# Patient Record
Sex: Female | Born: 1980 | Race: White | Hispanic: No | Marital: Married | Smoking: Former smoker
Health system: Southern US, Community
[De-identification: ages and names within clinical notes are randomized; demographics above are authoritative.]

## PROBLEM LIST (undated history)

## (undated) ENCOUNTER — Inpatient Hospital Stay (HOSPITAL_COMMUNITY): Payer: Self-pay

## (undated) DIAGNOSIS — F329 Major depressive disorder, single episode, unspecified: Secondary | ICD-10-CM

## (undated) DIAGNOSIS — J302 Other seasonal allergic rhinitis: Secondary | ICD-10-CM

## (undated) DIAGNOSIS — K219 Gastro-esophageal reflux disease without esophagitis: Secondary | ICD-10-CM

## (undated) DIAGNOSIS — Z872 Personal history of diseases of the skin and subcutaneous tissue: Secondary | ICD-10-CM

## (undated) DIAGNOSIS — F32A Depression, unspecified: Secondary | ICD-10-CM

## (undated) DIAGNOSIS — K5900 Constipation, unspecified: Secondary | ICD-10-CM

## (undated) DIAGNOSIS — O1205 Gestational edema, complicating the puerperium: Secondary | ICD-10-CM

## (undated) HISTORY — PX: NO PAST SURGERIES: SHX2092

## (undated) HISTORY — PX: WISDOM TOOTH EXTRACTION: SHX21

---
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
## 2003-05-12 ENCOUNTER — Other Ambulatory Visit: Admission: RE | Admit: 2003-05-12 | Discharge: 2003-05-12 | Payer: Self-pay | Admitting: Obstetrics & Gynecology

## 2003-11-19 ENCOUNTER — Emergency Department (HOSPITAL_COMMUNITY): Admission: EM | Admit: 2003-11-19 | Discharge: 2003-11-19 | Payer: Self-pay | Admitting: Emergency Medicine

## 2005-06-06 ENCOUNTER — Other Ambulatory Visit: Admission: RE | Admit: 2005-06-06 | Discharge: 2005-06-06 | Payer: Self-pay | Admitting: Family Medicine

## 2005-12-18 ENCOUNTER — Other Ambulatory Visit: Admission: RE | Admit: 2005-12-18 | Discharge: 2005-12-18 | Payer: Self-pay | Admitting: Family Medicine

## 2006-07-22 ENCOUNTER — Other Ambulatory Visit: Admission: RE | Admit: 2006-07-22 | Discharge: 2006-07-22 | Payer: Self-pay | Admitting: Family Medicine

## 2007-02-21 ENCOUNTER — Other Ambulatory Visit: Admission: RE | Admit: 2007-02-21 | Discharge: 2007-02-21 | Payer: Self-pay | Admitting: Family Medicine

## 2007-09-09 ENCOUNTER — Other Ambulatory Visit: Admission: RE | Admit: 2007-09-09 | Discharge: 2007-09-09 | Payer: Self-pay | Admitting: Family Medicine

## 2008-09-28 ENCOUNTER — Other Ambulatory Visit: Admission: RE | Admit: 2008-09-28 | Discharge: 2008-09-28 | Payer: Self-pay | Admitting: Family Medicine

## 2009-10-24 ENCOUNTER — Other Ambulatory Visit: Admission: RE | Admit: 2009-10-24 | Discharge: 2009-10-24 | Payer: Self-pay | Admitting: Family Medicine

## 2011-01-24 ENCOUNTER — Other Ambulatory Visit (HOSPITAL_COMMUNITY)
Admission: RE | Admit: 2011-01-24 | Discharge: 2011-01-24 | Disposition: A | Discharge status: Home / Self Care | Payer: BC Managed Care – PPO | Source: Ambulatory Visit | Attending: Family Medicine | Admitting: Family Medicine

## 2011-01-24 ENCOUNTER — Other Ambulatory Visit: Payer: Self-pay | Admitting: Family Medicine

## 2011-01-24 DIAGNOSIS — Z0000 Encounter for general adult medical examination without abnormal findings: Secondary | ICD-10-CM | POA: Insufficient documentation

## 2011-06-15 ENCOUNTER — Other Ambulatory Visit: Payer: Self-pay | Admitting: Dermatology

## 2012-04-01 ENCOUNTER — Other Ambulatory Visit: Payer: Self-pay | Admitting: Dermatology

## 2014-05-26 ENCOUNTER — Other Ambulatory Visit (HOSPITAL_COMMUNITY)
Admission: RE | Admit: 2014-05-26 | Discharge: 2014-05-26 | Disposition: A | Discharge status: Home / Self Care | Payer: BC Managed Care – PPO | Source: Ambulatory Visit | Attending: Family Medicine | Admitting: Family Medicine

## 2014-05-26 ENCOUNTER — Other Ambulatory Visit: Payer: Self-pay | Admitting: Family Medicine

## 2014-05-26 DIAGNOSIS — Z0000 Encounter for general adult medical examination without abnormal findings: Secondary | ICD-10-CM | POA: Insufficient documentation

## 2014-05-27 LAB — CYTOLOGY - PAP

## 2015-09-08 ENCOUNTER — Encounter (HOSPITAL_COMMUNITY): Payer: Self-pay | Admitting: Emergency Medicine

## 2015-09-08 ENCOUNTER — Emergency Department (HOSPITAL_COMMUNITY)
Admission: EM | Admit: 2015-09-08 | Discharge: 2015-09-08 | Disposition: A | Discharge status: Home / Self Care | Payer: BC Managed Care – PPO | Attending: Emergency Medicine | Admitting: Emergency Medicine

## 2015-09-08 DIAGNOSIS — Y92410 Unspecified street and highway as the place of occurrence of the external cause: Secondary | ICD-10-CM | POA: Insufficient documentation

## 2015-09-08 DIAGNOSIS — S0990XA Unspecified injury of head, initial encounter: Secondary | ICD-10-CM | POA: Diagnosis present

## 2015-09-08 DIAGNOSIS — Y9389 Activity, other specified: Secondary | ICD-10-CM | POA: Insufficient documentation

## 2015-09-08 DIAGNOSIS — Y998 Other external cause status: Secondary | ICD-10-CM | POA: Insufficient documentation

## 2015-09-08 DIAGNOSIS — R519 Headache, unspecified: Secondary | ICD-10-CM

## 2015-09-08 DIAGNOSIS — R51 Headache: Secondary | ICD-10-CM

## 2015-09-08 MED ORDER — IBUPROFEN 400 MG PO TABS
600.0000 mg | ORAL_TABLET | Freq: Once | ORAL | Status: AC
  Administered 2015-09-08: 600 mg via ORAL
  Filled 2015-09-08: qty 2
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
## 2015-09-08 NOTE — Discharge Instructions (Signed)
Please monitor for new or worsening signs or symptoms, return immediately. Please follow-up with her primary care provider if symptoms persist. Please use ibuprofen or Tylenol as needed.

## 2016-01-31 LAB — OB RESULTS CONSOLE ABO/RH: RH Type: POSITIVE

## 2016-01-31 LAB — OB RESULTS CONSOLE GC/CHLAMYDIA
Chlamydia: NEGATIVE
Gonorrhea: NEGATIVE

## 2016-01-31 LAB — OB RESULTS CONSOLE RUBELLA ANTIBODY, IGM: Rubella: IMMUNE

## 2016-01-31 LAB — OB RESULTS CONSOLE RPR: RPR: NONREACTIVE

## 2016-01-31 LAB — OB RESULTS CONSOLE HIV ANTIBODY (ROUTINE TESTING): HIV: NONREACTIVE

## 2016-01-31 LAB — OB RESULTS CONSOLE HEPATITIS B SURFACE ANTIGEN: Hepatitis B Surface Ag: NEGATIVE

## 2016-01-31 LAB — OB RESULTS CONSOLE ANTIBODY SCREEN: Antibody Screen: NEGATIVE

## 2016-08-09 ENCOUNTER — Inpatient Hospital Stay (HOSPITAL_COMMUNITY)
Admission: AD | Admit: 2016-08-09 | Discharge: 2016-08-09 | Disposition: A | Discharge status: Home / Self Care | Payer: BC Managed Care – PPO | Source: Ambulatory Visit | Attending: Obstetrics | Admitting: Obstetrics

## 2016-08-09 ENCOUNTER — Encounter (HOSPITAL_COMMUNITY): Payer: Self-pay | Admitting: *Deleted

## 2016-08-09 DIAGNOSIS — Z3689 Encounter for other specified antenatal screening: Secondary | ICD-10-CM

## 2016-08-09 DIAGNOSIS — K219 Gastro-esophageal reflux disease without esophagitis: Secondary | ICD-10-CM | POA: Diagnosis not present

## 2016-08-09 DIAGNOSIS — Z87891 Personal history of nicotine dependence: Secondary | ICD-10-CM | POA: Diagnosis not present

## 2016-08-09 DIAGNOSIS — Z3A36 36 weeks gestation of pregnancy: Secondary | ICD-10-CM

## 2016-08-09 DIAGNOSIS — Z3A35 35 weeks gestation of pregnancy: Secondary | ICD-10-CM | POA: Insufficient documentation

## 2016-08-09 DIAGNOSIS — O479 False labor, unspecified: Secondary | ICD-10-CM

## 2016-08-09 DIAGNOSIS — O99613 Diseases of the digestive system complicating pregnancy, third trimester: Secondary | ICD-10-CM | POA: Insufficient documentation

## 2016-08-09 DIAGNOSIS — O4703 False labor before 37 completed weeks of gestation, third trimester: Secondary | ICD-10-CM | POA: Diagnosis not present

## 2016-08-09 DIAGNOSIS — O368130 Decreased fetal movements, third trimester, not applicable or unspecified: Secondary | ICD-10-CM | POA: Diagnosis not present

## 2016-08-09 DIAGNOSIS — Z3493 Encounter for supervision of normal pregnancy, unspecified, third trimester: Secondary | ICD-10-CM

## 2016-08-09 HISTORY — DX: Other seasonal allergic rhinitis: J30.2

## 2016-08-09 HISTORY — DX: Gastro-esophageal reflux disease without esophagitis: K21.9

## 2016-08-09 LAB — URINALYSIS, ROUTINE W REFLEX MICROSCOPIC
Bilirubin Urine: NEGATIVE
Glucose, UA: NEGATIVE mg/dL
Hgb urine dipstick: NEGATIVE
Ketones, ur: NEGATIVE mg/dL
Leukocytes, UA: NEGATIVE
Nitrite: NEGATIVE
Protein, ur: NEGATIVE mg/dL
Specific Gravity, Urine: 1.015 (ref 1.005–1.030)
pH: 6 (ref 5.0–8.0)

## 2016-08-09 NOTE — MAU Provider Note (Signed)
History     CSN: [id number]  Arrival date and time: 08/09/16 1712   None     Chief Complaint  Patient presents with  . Contractions  . Decreased Fetal Movement   G1 @35 .5 weeks here with decreased fetal movement and abd tightening. She reports decreased movement throughout the day. She also reports intermittent tightening and sudden onset of pelvic pressure. She denies regular ctx, VB, and LOF. She has felt good FM since EFM was applied.     OB History    Gravida Para Term Preterm AB Living   1             SAB TAB Ectopic Multiple Live Births                  Past Medical History:  Diagnosis Date  . GERD (gastroesophageal reflux disease)   . Seasonal allergies     Past Surgical History:  Procedure Laterality Date  . NO PAST SURGERIES      History reviewed. No pertinent family history.  Social History  Substance Use Topics  . Smoking status: Former Smoker    Quit date: 08/09/2001  . Smokeless tobacco: Never Used  . Alcohol use Yes     Comment: socially/not since pregnancy    Allergies: No Known Allergies  No prescriptions prior to admission.    Review of Systems  Constitutional: Negative.   Gastrointestinal: Positive for abdominal pain.  Genitourinary: Negative.    Physical Exam   Blood pressure 131/81, pulse 92, temperature 98.3 F (36.8 C), temperature source Oral, resp. rate 18, last menstrual period 09/08/2015.  Physical Exam  Constitutional: She is oriented to person, place, and time. She appears well-developed and well-nourished.  HENT:  Head: Normocephalic and atraumatic.  Neck: Normal range of motion. Neck supple.  Cardiovascular: Normal rate.   Respiratory: Effort normal.  GI: Soft. She exhibits no distension. There is no tenderness.  gravid  Genitourinary:  Genitourinary Comments: SVE closed/thick/-2  Musculoskeletal: Normal range of motion.  Neurological: She is alert and oriented to person, place, and time.  Skin: Skin is warm and  dry.  Psychiatric: She has a normal mood and affect.  EFM: 140 bpm, mod variability, + accels, no decels Toco: irregular  Results for orders placed or performed during the hospital encounter of 08/09/16 (from the past 24 hour(s))  Urinalysis, Routine w reflex microscopic (not at [facility])     Status: None   Collection Time: 08/09/16  5:32 PM  Result Value Ref Range   Color, Urine YELLOW YELLOW   APPearance CLEAR CLEAR   Specific Gravity, Urine 1.015 1.005 - 1.030   pH 6.0 5.0 - 8.0   Glucose, UA NEGATIVE NEGATIVE mg/dL   Hgb urine dipstick NEGATIVE NEGATIVE   Bilirubin Urine NEGATIVE NEGATIVE   Ketones, ur NEGATIVE NEGATIVE mg/dL   Protein, ur NEGATIVE NEGATIVE mg/dL   Nitrite NEGATIVE NEGATIVE   Leukocytes, UA NEGATIVE NEGATIVE    MAU Course  Procedures NST MDM Reactive NST, reported FM since arrival, and audible FM while on EFM. Pressure likely 2/2 fetal engagement. No evidence of PTL. Discussed presentation, exam findings, and NST with Dr. Pamala Hurry. Stable for discharge home.   Assessment and Plan   1. Decreased fetal movements, third trimester, not applicable or unspecified fetus   2. NST (non-stress test) reactive   3. Braxton Hick's contraction   4. Lightening of fetus during pregnancy in third trimester    Discharge home Increase water intake FMCs Follow up as  scheduled in office next week  Julianne Handler, CNM 08/09/2016, 7:08 PM   Case d/w me. Agree with care plan. Non-emergent exam, high beta, u/s scheduled in office for 08/15/16  Particia Strahm A. 08/10/2016 2:22 PM

## 2016-08-09 NOTE — MAU Note (Signed)
Pt advised to come to MAU, called provider.  Started having uc's yesterday around 1500, increased pressure & rectum for the last 3 hours.  Decreased FM today.  Denies bleeding or LOF.

## 2016-08-09 NOTE — Discharge Instructions (Signed)
Fetal Movement Counts  Patient Name: __________________________________________________ Patient Due Date: ____________________  Performing a fetal movement count is highly recommended in high-risk pregnancies, but it is good for every pregnant woman to do. Your health care provider may ask you to start counting fetal movements at 28 weeks of the pregnancy. Fetal movements often increase:  · After eating a full meal.  · After physical activity.  · After eating or drinking something sweet or cold.  · At rest.  Pay attention to when you feel the baby is most active. This will help you notice a pattern of your baby's sleep and wake cycles and what factors contribute to an increase in fetal movement. It is important to perform a fetal movement count at the same time each day when your baby is normally most active.   HOW TO COUNT FETAL MOVEMENTS  1. Find a quiet and comfortable area to sit or lie down on your left side. Lying on your left side provides the best blood and oxygen circulation to your baby.  2. Write down the day and time on a sheet of paper or in a journal.  3. Start counting kicks, flutters, swishes, rolls, or jabs in a 2-hour period. You should feel at least 10 movements within 2 hours.  4. If you do not feel 10 movements in 2 hours, wait 2-3 hours and count again. Look for a change in the pattern or not enough counts in 2 hours.  SEEK MEDICAL CARE IF:  · You feel less than 10 counts in 2 hours, tried twice.  · There is no movement in over an hour.  · The pattern is changing or taking longer each day to reach 10 counts in 2 hours.  · You feel the baby is not moving as he or she usually does.  Date: ____________ Movements: ____________ Start time: ____________ Finish time: ____________   Date: ____________ Movements: ____________ Start time: ____________ Finish time: ____________  Date: ____________ Movements: ____________ Start time: ____________ Finish time: ____________  Date: ____________ Movements:  ____________ Start time: ____________ Finish time: ____________  Date: ____________ Movements: ____________ Start time: ____________ Finish time: ____________  Date: ____________ Movements: ____________ Start time: ____________ Finish time: ____________  Date: ____________ Movements: ____________ Start time: ____________ Finish time: ____________  Date: ____________ Movements: ____________ Start time: ____________ Finish time: ____________   Date: ____________ Movements: ____________ Start time: ____________ Finish time: ____________  Date: ____________ Movements: ____________ Start time: ____________ Finish time: ____________  Date: ____________ Movements: ____________ Start time: ____________ Finish time: ____________  Date: ____________ Movements: ____________ Start time: ____________ Finish time: ____________  Date: ____________ Movements: ____________ Start time: ____________ Finish time: ____________  Date: ____________ Movements: ____________ Start time: ____________ Finish time: ____________  Date: ____________ Movements: ____________ Start time: ____________ Finish time: ____________   Date: ____________ Movements: ____________ Start time: ____________ Finish time: ____________  Date: ____________ Movements: ____________ Start time: ____________ Finish time: ____________  Date: ____________ Movements: ____________ Start time: ____________ Finish time: ____________  Date: ____________ Movements: ____________ Start time: ____________ Finish time: ____________  Date: ____________ Movements: ____________ Start time: ____________ Finish time: ____________  Date: ____________ Movements: ____________ Start time: ____________ Finish time: ____________  Date: ____________ Movements: ____________ Start time: ____________ Finish time: ____________   Date: ____________ Movements: ____________ Start time: ____________ Finish time: ____________  Date: ____________ Movements: ____________ Start time: ____________ Finish  time: ____________  Date: ____________ Movements: ____________ Start time: ____________ Finish time: ____________  Date: ____________ Movements: ____________ Start time:   ____________ Finish time: ____________  Date: ____________ Movements: ____________ Start time: ____________ Finish time: ____________  Date: ____________ Movements: ____________ Start time: ____________ Finish time: ____________  Date: ____________ Movements: ____________ Start time: ____________ Finish time: ____________   Date: ____________ Movements: ____________ Start time: ____________ Finish time: ____________  Date: ____________ Movements: ____________ Start time: ____________ Finish time: ____________  Date: ____________ Movements: ____________ Start time: ____________ Finish time: ____________  Date: ____________ Movements: ____________ Start time: ____________ Finish time: ____________  Date: ____________ Movements: ____________ Start time: ____________ Finish time: ____________  Date: ____________ Movements: ____________ Start time: ____________ Finish time: ____________  Date: ____________ Movements: ____________ Start time: ____________ Finish time: ____________   Date: ____________ Movements: ____________ Start time: ____________ Finish time: ____________  Date: ____________ Movements: ____________ Start time: ____________ Finish time: ____________  Date: ____________ Movements: ____________ Start time: ____________ Finish time: ____________  Date: ____________ Movements: ____________ Start time: ____________ Finish time: ____________  Date: ____________ Movements: ____________ Start time: ____________ Finish time: ____________  Date: ____________ Movements: ____________ Start time: ____________ Finish time: ____________  Date: ____________ Movements: ____________ Start time: ____________ Finish time: ____________   Date: ____________ Movements: ____________ Start time: ____________ Finish time: ____________  Date: ____________  Movements: ____________ Start time: ____________ Finish time: ____________  Date: ____________ Movements: ____________ Start time: ____________ Finish time: ____________  Date: ____________ Movements: ____________ Start time: ____________ Finish time: ____________  Date: ____________ Movements: ____________ Start time: ____________ Finish time: ____________  Date: ____________ Movements: ____________ Start time: ____________ Finish time: ____________  Date: ____________ Movements: ____________ Start time: ____________ Finish time: ____________   Date: ____________ Movements: ____________ Start time: ____________ Finish time: ____________  Date: ____________ Movements: ____________ Start time: ____________ Finish time: ____________  Date: ____________ Movements: ____________ Start time: ____________ Finish time: ____________  Date: ____________ Movements: ____________ Start time: ____________ Finish time: ____________  Date: ____________ Movements: ____________ Start time: ____________ Finish time: ____________  Date: ____________ Movements: ____________ Start time: ____________ Finish time: ____________     This information is not intended to replace advice given to you by your health care provider. Make sure you discuss any questions you have with your health care provider.     Document Released: 12/26/2006 Document Revised: 12/17/2014 Document Reviewed: 09/22/2012  Elsevier Interactive Patient Education ©2016 [employer]

## 2016-08-15 LAB — OB RESULTS CONSOLE GBS: GBS: NEGATIVE

## 2016-08-28 ENCOUNTER — Inpatient Hospital Stay (HOSPITAL_COMMUNITY)
Admission: AD | Admit: 2016-08-28 | Discharge: 2016-08-28 | Disposition: A | Discharge status: Home / Self Care | Payer: BC Managed Care – PPO | Source: Ambulatory Visit | Attending: Obstetrics and Gynecology | Admitting: Obstetrics and Gynecology

## 2016-08-28 DIAGNOSIS — O163 Unspecified maternal hypertension, third trimester: Secondary | ICD-10-CM

## 2016-08-28 DIAGNOSIS — R030 Elevated blood-pressure reading, without diagnosis of hypertension: Secondary | ICD-10-CM | POA: Insufficient documentation

## 2016-08-28 DIAGNOSIS — O368130 Decreased fetal movements, third trimester, not applicable or unspecified: Secondary | ICD-10-CM | POA: Diagnosis present

## 2016-08-28 DIAGNOSIS — Z87891 Personal history of nicotine dependence: Secondary | ICD-10-CM | POA: Insufficient documentation

## 2016-08-28 DIAGNOSIS — Z3A38 38 weeks gestation of pregnancy: Secondary | ICD-10-CM | POA: Insufficient documentation

## 2016-08-28 DIAGNOSIS — O26893 Other specified pregnancy related conditions, third trimester: Secondary | ICD-10-CM | POA: Insufficient documentation

## 2016-08-28 LAB — COMPREHENSIVE METABOLIC PANEL
ALT: 16 U/L (ref 14–54)
AST: 22 U/L (ref 15–41)
Albumin: 2.8 g/dL — ABNORMAL LOW (ref 3.5–5.0)
Alkaline Phosphatase: 121 U/L (ref 38–126)
Anion gap: 9 (ref 5–15)
BUN: 11 mg/dL (ref 6–20)
CO2: 20 mmol/L — ABNORMAL LOW (ref 22–32)
Calcium: 8.7 mg/dL — ABNORMAL LOW (ref 8.9–10.3)
Chloride: 102 mmol/L (ref 101–111)
Creatinine, Ser: 0.67 mg/dL (ref 0.44–1.00)
GFR calc Af Amer: >60 mL/min (ref >60)
GFR calc non Af Amer: >60 mL/min (ref >60)
Glucose, Bld: 83 mg/dL (ref 65–99)
Potassium: 4 mmol/L (ref 3.5–5.1)
Sodium: 131 mmol/L — ABNORMAL LOW (ref 135–145)
Total Bilirubin: 0.4 mg/dL (ref 0.3–1.2)
Total Protein: 6.8 g/dL (ref 6.5–8.1)

## 2016-08-28 LAB — URINALYSIS, ROUTINE W REFLEX MICROSCOPIC
Bilirubin Urine: NEGATIVE
Glucose, UA: NEGATIVE mg/dL
Hgb urine dipstick: NEGATIVE
Ketones, ur: NEGATIVE mg/dL
Leukocytes, UA: NEGATIVE
Nitrite: NEGATIVE
Protein, ur: NEGATIVE mg/dL
Specific Gravity, Urine: <1.005 — ABNORMAL LOW (ref 1.005–1.030)
pH: 5.5 (ref 5.0–8.0)

## 2016-08-28 LAB — CBC WITH DIFFERENTIAL/PLATELET
Basophils Absolute: 0 10*3/uL (ref 0.0–0.1)
Basophils Relative: 0 %
Eosinophils Absolute: 0.1 10*3/uL (ref 0.0–0.7)
Eosinophils Relative: 1 %
HCT: 33.1 % — ABNORMAL LOW (ref 36.0–46.0)
Hemoglobin: 11 g/dL — ABNORMAL LOW (ref 12.0–15.0)
Lymphocytes Relative: 21 %
Lymphs Abs: 1.9 10*3/uL (ref 0.7–4.0)
MCH: 29.5 pg (ref 26.0–34.0)
MCHC: 33.2 g/dL (ref 30.0–36.0)
MCV: 88.7 fL (ref 78.0–100.0)
Monocytes Absolute: 0.5 10*3/uL (ref 0.1–1.0)
Monocytes Relative: 6 %
Neutro Abs: 6.5 10*3/uL (ref 1.7–7.7)
Neutrophils Relative %: 72 %
Platelets: 188 10*3/uL (ref 150–400)
RBC: 3.73 MIL/uL — ABNORMAL LOW (ref 3.87–5.11)
RDW: 13.7 % (ref 11.5–15.5)
WBC: 9 10*3/uL (ref 4.0–10.5)

## 2016-08-28 LAB — PROTEIN / CREATININE RATIO, URINE
Creatinine, Urine: 28 mg/dL
Total Protein, Urine: <6 mg/dL

## 2016-08-28 LAB — LACTATE DEHYDROGENASE: LDH: 180 U/L (ref 98–192)

## 2016-08-28 LAB — URIC ACID: Uric Acid, Serum: 3.7 mg/dL (ref 2.3–6.6)

## 2016-08-28 NOTE — MAU Provider Note (Signed)
History     CSN: [id number]  Arrival date and time: 08/28/16 1640   First Provider Initiated Contact with Patient 08/28/16 1721      Chief Complaint  Patient presents with  . Decreased Fetal Movement  . Leg Swelling   HPIpt is 35 y.o.G1P0 at [redacted]w[redacted]d pregnant and presents with sudden onset of facial swelling for a couple of days and feels like a balloon; calves and knees and feet and hands extra puffy today.  Palms of hands reddened. Pt denies headache, spotting or bleeding, UTI sx or constipation. Pt states that baby is less active today. Kandyce Rud, RN    [] Hide copied text [] Hover for attribution information Legs more swollen today and higher up on legs. Have not felt baby move as much today. Have had some b/p elevations in past wk. No headaches, epigastric pain or n/v. Some blurry vision when reading in lobby but no spots or flashes       Past Medical History:  Diagnosis Date  . GERD (gastroesophageal reflux disease)   . Seasonal allergies     Past Surgical History:  Procedure Laterality Date  . NO PAST SURGERIES      No family history on file.  Social History  Substance Use Topics  . Smoking status: Former Smoker    Quit date: 08/09/2001  . Smokeless tobacco: Never Used  . Alcohol use Yes     Comment: socially/not since pregnancy    Allergies: No Known Allergies  Prescriptions Prior to Admission  Medication Sig Dispense Refill Last Dose  . acetaminophen (TYLENOL) 325 MG tablet Take 650 mg by mouth every 6 (six) hours as needed for mild pain, moderate pain or headache.   Past Week at Unknown time  . cetirizine (ZYRTEC) 10 MG tablet Take 10 mg by mouth daily as needed for allergies.   Past Month at Unknown time  . lansoprazole (PREVACID) 30 MG capsule Take 1 capsule by mouth daily.   08/27/2016 at Unknown time  . Prenatal Vit-Fe Fumarate-FA (PRENATAL MULTIVITAMIN) TABS tablet Take 1 tablet by mouth daily after breakfast.    08/28/2016 at Unknown time    Review  of Systems  Constitutional: Negative for chills and fever.  Eyes: Positive for blurred vision.  Gastrointestinal: Negative for abdominal pain, constipation, diarrhea, heartburn, nausea and vomiting.  Musculoskeletal: Positive for back pain.  Neurological: Negative for headaches.   Physical Exam   Blood pressure 124/77, pulse 108, resp. rate 18, height 5\' 2"  (1.575 m), weight 225 lb (102.1 kg), last menstrual period 09/08/2015.  Physical Exam  Nursing note and vitals reviewed. Constitutional: She is oriented to person, place, and time. She appears well-developed and well-nourished. No distress.  HENT:  Head: Normocephalic.  Eyes: Pupils are equal, round, and reactive to light.  Neck: Normal range of motion. Neck supple.  Cardiovascular: Normal rate.   Respiratory: Effort normal. No respiratory distress.  GI: She exhibits no distension. There is no tenderness. There is no rebound and no guarding.  Musculoskeletal: Normal range of motion. She exhibits edema.  2+ edema in legs  Neurological: She is alert and oriented to person, place, and time.  Skin: Skin is warm and dry.  Psychiatric: She has a normal mood and affect.    MAU Course  Procedures Results for orders placed or performed during the hospital encounter of 08/28/16 (from the past 24 hour(s))  Urinalysis, Routine w reflex microscopic (not at [facility])     Status: Abnormal   Collection Time: 08/28/16  4:55 PM  Result Value Ref Range   Color, Urine YELLOW YELLOW   APPearance CLEAR CLEAR   Specific Gravity, Urine <1.005 (L) 1.005 - 1.030   pH 5.5 5.0 - 8.0   Glucose, UA NEGATIVE NEGATIVE mg/dL   Hgb urine dipstick NEGATIVE NEGATIVE   Bilirubin Urine NEGATIVE NEGATIVE   Ketones, ur NEGATIVE NEGATIVE mg/dL   Protein, ur NEGATIVE NEGATIVE mg/dL   Nitrite NEGATIVE NEGATIVE   Leukocytes, UA NEGATIVE NEGATIVE  CBC with Differential     Status: Abnormal   Collection Time: 08/28/16  5:46 PM  Result Value Ref Range   WBC 9.0 4.0  - 10.5 K/uL   RBC 3.73 (L) 3.87 - 5.11 MIL/uL   Hemoglobin 11.0 (L) 12.0 - 15.0 g/dL   HCT 33.1 (L) 36.0 - 46.0 %   MCV 88.7 78.0 - 100.0 fL   MCH 29.5 26.0 - 34.0 pg   MCHC 33.2 30.0 - 36.0 g/dL   RDW 13.7 11.5 - 15.5 %   Platelets 188 150 - 400 K/uL   Neutrophils Relative % 72 %   Neutro Abs 6.5 1.7 - 7.7 K/uL   Lymphocytes Relative 21 %   Lymphs Abs 1.9 0.7 - 4.0 K/uL   Monocytes Relative 6 %   Monocytes Absolute 0.5 0.1 - 1.0 K/uL   Eosinophils Relative 1 %   Eosinophils Absolute 0.1 0.0 - 0.7 K/uL   Basophils Relative 0 %   Basophils Absolute 0.0 0.0 - 0.1 K/uL   Results for orders placed or performed during the hospital encounter of 08/28/16 (from the past 24 hour(s))  Urinalysis, Routine w reflex microscopic (not at [facility])     Status: Abnormal   Collection Time: 08/28/16  4:55 PM  Result Value Ref Range   Color, Urine YELLOW YELLOW   APPearance CLEAR CLEAR   Specific Gravity, Urine <1.005 (L) 1.005 - 1.030   pH 5.5 5.0 - 8.0   Glucose, UA NEGATIVE NEGATIVE mg/dL   Hgb urine dipstick NEGATIVE NEGATIVE   Bilirubin Urine NEGATIVE NEGATIVE   Ketones, ur NEGATIVE NEGATIVE mg/dL   Protein, ur NEGATIVE NEGATIVE mg/dL   Nitrite NEGATIVE NEGATIVE   Leukocytes, UA NEGATIVE NEGATIVE  CBC with Differential     Status: Abnormal   Collection Time: 08/28/16  5:46 PM  Result Value Ref Range   WBC 9.0 4.0 - 10.5 K/uL   RBC 3.73 (L) 3.87 - 5.11 MIL/uL   Hemoglobin 11.0 (L) 12.0 - 15.0 g/dL   HCT 33.1 (L) 36.0 - 46.0 %   MCV 88.7 78.0 - 100.0 fL   MCH 29.5 26.0 - 34.0 pg   MCHC 33.2 30.0 - 36.0 g/dL   RDW 13.7 11.5 - 15.5 %   Platelets 188 150 - 400 K/uL   Neutrophils Relative % 72 %   Neutro Abs 6.5 1.7 - 7.7 K/uL   Lymphocytes Relative 21 %   Lymphs Abs 1.9 0.7 - 4.0 K/uL   Monocytes Relative 6 %   Monocytes Absolute 0.5 0.1 - 1.0 K/uL   Eosinophils Relative 1 %   Eosinophils Absolute 0.1 0.0 - 0.7 K/uL   Basophils Relative 0 %   Basophils Absolute 0.0 0.0 - 0.1 K/uL    Comprehensive metabolic panel     Status: Abnormal   Collection Time: 08/28/16  5:46 PM  Result Value Ref Range   Sodium 131 (L) 135 - 145 mmol/L   Potassium 4.0 3.5 - 5.1 mmol/L   Chloride 102 101 - 111 mmol/L  CO2 20 (L) 22 - 32 mmol/L   Glucose, Bld 83 65 - 99 mg/dL   BUN 11 6 - 20 mg/dL   Creatinine, Ser 0.67 0.44 - 1.00 mg/dL   Calcium 8.7 (L) 8.9 - 10.3 mg/dL   Total Protein 6.8 6.5 - 8.1 g/dL   Albumin 2.8 (L) 3.5 - 5.0 g/dL   AST 22 15 - 41 U/L   ALT 16 14 - 54 U/L   Alkaline Phosphatase 121 38 - 126 U/L   Total Bilirubin 0.4 0.3 - 1.2 mg/dL   GFR calc non Af Amer >60 >60 mL/min   GFR calc Af Amer >60 >60 mL/min   Anion gap 9 5 - 15  Uric acid     Status: None   Collection Time: 08/28/16  5:46 PM  Result Value Ref Range   Uric Acid, Serum 3.7 2.3 - 6.6 mg/dL  Lactate dehydrogenase     Status: None   Collection Time: 08/28/16  5:46 PM  Result Value Ref Range   LDH 180 98 - 192 U/L   BP 141/90 on admission - highest BP Reactive NST with one variable dec for 2 minutes with return to baseline- baseline 125-130 with 6-25 bpm variability and 15x15 accelerations some UI Pt is asymptomatic with normal labs  Discussed with Dr. Ronita Hipps- pt may be discharged home and f/u with scheduled OB appointment (Friday) Discussed with  Pt signs of preeclampsia and need to return Assessment and Plan  Elevated BP in pregnancy- normal labs Decreased FM- reactive NST  Information on Braxton Hicks, [place], Pregnancy 3rd trimester F/u with OB appointment as scheduled Return with any headache, abd pain, spotting or bleeding  Pat Sires 08/28/2016, 5:21 PM

## 2016-08-28 NOTE — MAU Note (Signed)
Legs more swollen today and higher up on legs. Have not felt baby move as much today. Have had some b/p elevations in past wk. No headaches, epigastric pain or n/v. Some blurry vision when reading in lobby but no spots or flashes.

## 2016-08-28 NOTE — Discharge Instructions (Signed)
Hypertension During Pregnancy Hypertension, or high blood pressure, is when there is extra pressure inside your blood vessels that carry blood from the heart to the rest of your body (arteries). It can happen at any time in life, including pregnancy. Hypertension during pregnancy can cause problems for you and your baby. Your baby might not weigh as much as he or she should at birth or might be born early (premature). Very bad cases of hypertension during pregnancy can be life-threatening.  Different types of hypertension can occur during pregnancy. These include:  Chronic hypertension. This happens when a woman has hypertension before pregnancy and it continues during pregnancy.  Gestational hypertension. This is when hypertension develops during pregnancy.  Preeclampsia or toxemia of pregnancy. This is a very serious type of hypertension that develops only during pregnancy. It affects the whole body and can be very dangerous for both mother and baby.  Gestational hypertension and preeclampsia usually go away after your baby is born. Your blood pressure will likely stabilize within 6 weeks. Women who have hypertension during pregnancy have a greater chance of developing hypertension later in life or with future pregnancies. RISK FACTORS There are certain factors that make it more likely for you to develop hypertension during pregnancy. These include:  Having hypertension before pregnancy.  Having hypertension during a previous pregnancy.  Being overweight.  Being older than 40 years.  Being pregnant with more than one baby.  Having diabetes or kidney problems. SIGNS AND SYMPTOMS Chronic and gestational hypertension rarely cause symptoms. Preeclampsia has symptoms, which may include:  Increased protein in your urine. Your health care provider will check for this at every prenatal visit.  Swelling of your hands and face.  Rapid weight gain.  Headaches.  Visual changes.  Being  bothered by light.  Abdominal pain, especially in the upper right area.  Chest pain.  Shortness of breath.  Increased reflexes.  Seizures. These occur with a more severe form of preeclampsia, called eclampsia. DIAGNOSIS  You may be diagnosed with hypertension during a regular prenatal exam. At each prenatal visit, you may have:  Your blood pressure checked.  A urine test to check for protein in your urine. The type of hypertension you are diagnosed with depends on when you developed it. It also depends on your specific blood pressure reading.  Developing hypertension before 20 weeks of pregnancy is consistent with chronic hypertension.  Developing hypertension after 20 weeks of pregnancy is consistent with gestational hypertension.  Hypertension with increased urinary protein is diagnosed as preeclampsia.  Blood pressure measurements that stay above 834 systolic or 196 diastolic are a sign of severe preeclampsia. TREATMENT Treatment for hypertension during pregnancy varies. Treatment depends on the type of hypertension and how serious it is.  If you take medicine for chronic hypertension, you may need to switch medicines.  Medicines called ACE inhibitors should not be taken during pregnancy.  Low-dose aspirin may be suggested for women who have risk factors for preeclampsia.  If you have gestational hypertension, you may need to take a blood pressure medicine that is safe during pregnancy. Your health care provider will recommend the correct medicine.  If you have severe preeclampsia, you may need to be in the hospital. Health care providers will watch you and your baby very closely. You also may need to take medicine called magnesium sulfate to prevent seizures and lower blood pressure.  Sometimes, an early delivery is needed. This may be the case if the condition worsens. It would be  done to protect you and your baby. The only cure for preeclampsia is delivery.  Your health  care provider may recommend that you take one low-dose aspirin (81 mg) each day to help prevent high blood pressure during your pregnancy if you are at risk for preeclampsia. You may be at risk for preeclampsia if:  You had preeclampsia or eclampsia during a previous pregnancy.  Your baby did not grow as expected during a previous pregnancy.  You experienced preterm birth with a previous pregnancy.  You experienced a separation of the placenta from the uterus (placental abruption) during a previous pregnancy.  You experienced the loss of your baby during a previous pregnancy.  You are pregnant with more than one baby.  You have other medical conditions, such as diabetes or an autoimmune disease. HOME CARE INSTRUCTIONS  Schedule and keep all of your regular prenatal care appointments. This is important.  Take medicines only as directed by your health care provider. Tell your health care provider about all medicines you take.  Eat as little salt as possible.  Get regular exercise.  Do not drink alcohol.  Do not use tobacco products.  Do not drink products with caffeine.  Lie on your left side when resting. SEEK IMMEDIATE MEDICAL CARE IF:  You have severe abdominal pain.  You have sudden swelling in your hands, ankles, or face.  You gain 4 pounds (1.8 kg) or more in 1 week.  You vomit repeatedly.  You have vaginal bleeding.  You do not feel your baby moving as much.  You have a headache.  You have blurred or double vision.  You have muscle twitching or spasms.  You have shortness of breath.  You have blue fingernails or lips.  You have blood in your urine. MAKE SURE YOU:  Understand these instructions.  Will watch your condition.  Will get help right away if you are not doing well or get worse.   This information is not intended to replace advice given to you by your health care provider. Make sure you discuss any questions you have with your health care  provider.   Document Released: 08/14/2011 Document Revised: 12/17/2014 Document Reviewed: 06/25/2013 Elsevier Interactive Patient Education 2016 [place].  Fetal Movement Counts Patient Name: __________________________________________________ Patient Due Date: ____________________ Performing a fetal movement count is highly recommended in high-risk pregnancies, but it is good for every pregnant woman to do. Your health care provider may ask you to start counting fetal movements at 28 weeks of the pregnancy. Fetal movements often increase:  After eating a full meal.  After physical activity.  After eating or drinking something sweet or cold.  At rest. Pay attention to when you feel the baby is most active. This will help you notice a pattern of your baby's sleep and wake cycles and what factors contribute to an increase in fetal movement. It is important to perform a fetal movement count at the same time each day when your baby is normally most active.  HOW TO COUNT FETAL MOVEMENTS 1. Find a quiet and comfortable area to sit or lie down on your left side. Lying on your left side provides the best blood and oxygen circulation to your baby. 2. Write down the day and time on a sheet of paper or in a journal. 3. Start counting kicks, flutters, swishes, rolls, or jabs in a 2-hour period. You should feel at least 10 movements within 2 hours. 4. If you do not feel 10 movements in  2 hours, wait 2-3 hours and count again. Look for a change in the pattern or not enough counts in 2 hours. SEEK MEDICAL CARE IF:  You feel less than 10 counts in 2 hours, tried twice.  There is no movement in over an hour.  The pattern is changing or taking longer each day to reach 10 counts in 2 hours.  You feel the baby is not moving as he or she usually does. Date: ____________ Movements: ____________ Start time: ____________ Jeanette [name] time: ____________  Date: ____________ Movements: ____________ Start time:  ____________ Jeanette [name] time: ____________ Date: ____________ Movements: ____________ Start time: ____________ Jeanette [name] time: ____________ Date: ____________ Movements: ____________ Start time: ____________ Jeanette [name] time: ____________ Date: ____________ Movements: ____________ Start time: ____________ Jeanette [name] time: ____________ Date: ____________ Movements: ____________ Start time: ____________ Jeanette [name] time: ____________ Date: ____________ Movements: ____________ Start time: ____________ Jeanette [name] time: ____________ Date: ____________ Movements: ____________ Start time: ____________ Jeanette [name] time: ____________  Date: ____________ Movements: ____________ Start time: ____________ Jeanette [name] time: ____________ Date: ____________ Movements: ____________ Start time: ____________ Jeanette [name] time: ____________ Date: ____________ Movements: ____________ Start time: ____________ Jeanette [name] time: ____________ Date: ____________ Movements: ____________ Start time: ____________ Jeanette [name] time: ____________ Date: ____________ Movements: ____________ Start time: ____________ Jeanette [name] time: ____________ Date: ____________ Movements: ____________ Start time: ____________ Jeanette [name] time: ____________ Date: ____________ Movements: ____________ Start time: ____________ Jeanette [name] time: ____________  Date: ____________ Movements: ____________ Start time: ____________ Jeanette [name] time: ____________ Date: ____________ Movements: ____________ Start time: ____________ Jeanette [name] time: ____________ Date: ____________ Movements: ____________ Start time: ____________ Jeanette [name] time: ____________ Date: ____________ Movements: ____________ Start time: ____________ Jeanette [name] time: ____________ Date: ____________ Movements: ____________ Start time: ____________ Jeanette [name] time: ____________ Date: ____________ Movements: ____________ Start time: ____________ Jeanette [name] time: ____________ Date: ____________ Movements: ____________ Start time: ____________ Jeanette [name] time: ____________  Date:  ____________ Movements: ____________ Start time: ____________ Jeanette [name] time: ____________ Date: ____________ Movements: ____________ Start time: ____________ Jeanette [name] time: ____________ Date: ____________ Movements: ____________ Start time: ____________ Jeanette [name] time: ____________ Date: ____________ Movements: ____________ Start time: ____________ Jeanette [name] time: ____________ Date: ____________ Movements: ____________ Start time: ____________ Jeanette [name] time: ____________ Date: ____________ Movements: ____________ Start time: ____________ Jeanette [name] time: ____________ Date: ____________ Movements: ____________ Start time: ____________ Jeanette [name] time: ____________  Date: ____________ Movements: ____________ Start time: ____________ Jeanette [name] time: ____________ Date: ____________ Movements: ____________ Start time: ____________ Jeanette [name] time: ____________ Date: ____________ Movements: ____________ Start time: ____________ Jeanette [name] time: ____________ Date: ____________ Movements: ____________ Start time: ____________ Jeanette [name] time: ____________ Date: ____________ Movements: ____________ Start time: ____________ Jeanette [name] time: ____________ Date: ____________ Movements: ____________ Start time: ____________ Jeanette [name] time: ____________ Date: ____________ Movements: ____________ Start time: ____________ Jeanette [name] time: ____________  Date: ____________ Movements: ____________ Start time: ____________ Jeanette [name] time: ____________ Date: ____________ Movements: ____________ Start time: ____________ Jeanette [name] time: ____________ Date: ____________ Movements: ____________ Start time: ____________ Jeanette [name] time: ____________ Date: ____________ Movements: ____________ Start time: ____________ Jeanette [name] time: ____________ Date: ____________ Movements: ____________ Start time: ____________ Jeanette [name] time: ____________ Date: ____________ Movements: ____________ Start time: ____________ Jeanette [name] time: ____________ Date: ____________ Movements: ____________ Start  time: ____________ Jeanette [name] time: ____________  Date: ____________ Movements: ____________ Start time: ____________ Jeanette [name] time: ____________ Date: ____________ Movements: ____________ Start time: ____________ Jeanette [name] time: ____________ Date: ____________ Movements: ____________ Start time: ____________ Jeanette [name] time: ____________ Date: ____________ Movements: ____________ Start time: ____________ Jeanette [name] time: ____________ Date: ____________ Movements: ____________ Start time: ____________ Jeanette [name] time: ____________ Date: ____________ Movements: ____________ Start time: ____________ Jeanette [name] time: ____________ Date: ____________ Movements: ____________ Start time: ____________ Jeanette [name] time: ____________  Date: ____________ Movements: ____________ Start time: ____________ Jeanette [name] time: ____________ Date:  ____________ Movements: ____________ Start time: ____________ Finish time: ____________ °Date: ____________ Movements: ____________ Start time: ____________ Finish time: ____________ °Date: ____________ Movements: ____________ Start time: ____________ Finish time: ____________ °Date: ____________ Movements: ____________ Start time: ____________ Finish time: ____________ °Date: ____________ Movements: ____________ Start time: ____________ Finish time: ____________ °  °This information is not intended to replace advice given to you by your health care provider. Make sure you discuss any questions you have with your health care provider. °  °Document Released: 12/26/2006 Document Revised: 12/17/2014 Document Reviewed: 09/22/2012 °Elsevier Interactive Patient Education ©2016 [employer] ° °

## 2016-09-03 ENCOUNTER — Inpatient Hospital Stay (HOSPITAL_COMMUNITY): Admission: AD | Admit: 2016-09-03 | Payer: Self-pay | Source: Ambulatory Visit | Admitting: Obstetrics & Gynecology

## 2016-09-03 ENCOUNTER — Inpatient Hospital Stay (HOSPITAL_COMMUNITY)
Admission: AD | Admit: 2016-09-03 | Discharge: 2016-09-03 | Disposition: A | Discharge status: Home / Self Care | Payer: BC Managed Care – PPO | Source: Ambulatory Visit | Attending: Obstetrics & Gynecology | Admitting: Obstetrics & Gynecology

## 2016-09-03 ENCOUNTER — Encounter (HOSPITAL_COMMUNITY): Payer: Self-pay | Admitting: *Deleted

## 2016-09-03 DIAGNOSIS — Z3403 Encounter for supervision of normal first pregnancy, third trimester: Secondary | ICD-10-CM | POA: Diagnosis not present

## 2016-09-03 NOTE — MAU Note (Signed)
Pt reports contractions, pressure and back pain.

## 2016-09-03 NOTE — Discharge Instructions (Signed)
Braxton Hicks Contractions °Contractions of the uterus can occur throughout pregnancy. Contractions are not always a sign that you are in labor.  °WHAT ARE BRAXTON HICKS CONTRACTIONS?  °Contractions that occur before labor are called Braxton Hicks contractions, or false labor. Toward the end of pregnancy (32-34 weeks), these contractions can develop more often and may become more forceful. This is not true labor because these contractions do not result in opening (dilatation) and thinning of the cervix. They are sometimes difficult to tell apart from true labor because these contractions can be forceful and people have different pain tolerances. You should not feel embarrassed if you go to the hospital with false labor. Sometimes, the only way to tell if you are in true labor is for your health care provider to look for changes in the cervix. °If there are no prenatal problems or other health problems associated with the pregnancy, it is completely safe to be sent home with false labor and await the onset of true labor. °HOW CAN YOU TELL THE DIFFERENCE BETWEEN TRUE AND FALSE LABOR? °False Labor °· The contractions of false labor are usually shorter and not as hard as those of true labor.   °· The contractions are usually irregular.   °· The contractions are often felt in the front of the lower abdomen and in the groin.   °· The contractions may go away when you walk around or change positions while lying down.   °· The contractions get weaker and are shorter lasting as time goes on.   °· The contractions do not usually become progressively stronger, regular, and closer together as with true labor.   °True Labor °· Contractions in true labor last 30-70 seconds, become very regular, usually become more intense, and increase in frequency.   °· The contractions do not go away with walking.   °· The discomfort is usually felt in the top of the uterus and spreads to the lower abdomen and low back.   °· True labor can be  determined by your health care provider with an exam. This will show that the cervix is dilating and getting thinner.   °WHAT TO REMEMBER °· Keep up with your usual exercises and follow other instructions given by your health care provider.   °· Take medicines as directed by your health care provider.   °· Keep your regular prenatal appointments.   °· Eat and drink lightly if you think you are going into labor.   °· If Braxton Hicks contractions are making you uncomfortable:   °¨ Change your position from lying down or resting to walking, or from walking to resting.   °¨ Sit and rest in a tub of warm water.   °¨ Drink 2-3 glasses of water. Dehydration may cause these contractions.   °¨ Do slow and deep breathing several times an hour.   °WHEN SHOULD I SEEK IMMEDIATE MEDICAL CARE? °Seek immediate medical care if: °· Your contractions become stronger, more regular, and closer together.   °· You have fluid leaking or gushing from your vagina.   °· You have a fever.   °· You pass blood-tinged mucus.   °· You have vaginal bleeding.   °· You have continuous abdominal pain.   °· You have low back pain that you never had before.   °· You feel your baby's head pushing down and causing pelvic pressure.   °· Your baby is not moving as much as it used to.   °  °This information is not intended to replace advice given to you by your health care provider. Make sure you discuss any questions you have with your health care   provider. °  °Document Released: 11/26/2005 Document Revised: 12/01/2013 Document Reviewed: 09/07/2013 °Elsevier Interactive Patient Education ©2016 Elsevier Inc[employer] ° °

## 2016-09-04 ENCOUNTER — Inpatient Hospital Stay (HOSPITAL_COMMUNITY)
Admission: AD | Admit: 2016-09-04 | Discharge: 2016-09-04 | Disposition: A | Discharge status: Home / Self Care | Payer: BC Managed Care – PPO | Source: Ambulatory Visit | Attending: Obstetrics | Admitting: Obstetrics

## 2016-09-04 ENCOUNTER — Inpatient Hospital Stay (HOSPITAL_COMMUNITY): Admission: AD | Admit: 2016-09-04 | Payer: Self-pay | Admitting: Obstetrics

## 2016-09-04 ENCOUNTER — Encounter (HOSPITAL_COMMUNITY): Payer: Self-pay

## 2016-09-04 DIAGNOSIS — M549 Dorsalgia, unspecified: Secondary | ICD-10-CM | POA: Insufficient documentation

## 2016-09-04 DIAGNOSIS — O26893 Other specified pregnancy related conditions, third trimester: Secondary | ICD-10-CM

## 2016-09-04 DIAGNOSIS — Z3A39 39 weeks gestation of pregnancy: Secondary | ICD-10-CM | POA: Insufficient documentation

## 2016-09-04 MED ORDER — CYCLOBENZAPRINE HCL 10 MG PO TABS
10.0000 mg | ORAL_TABLET | Freq: Once | ORAL | Status: AC
  Administered 2016-09-04: 10 mg via ORAL
  Filled 2016-09-04: qty 1

## 2016-09-04 NOTE — MAU Note (Signed)
Pt reports contractions are stronger but not closer, pain is worse. Lower back pain. Denies ROM, reports brownish mucous discharge.

## 2016-09-04 NOTE — MAU Provider Note (Signed)
S: Jeanette [name] is a 35 y.o. G1P0 at [redacted]w[redacted]d who presents today with back pain and "chest pain". She rates her pain 3/10. She states that the pain is on the upper pectoral area, and she feels like she "pulled a muscle". She is also reports contractions. She denies any VB or LOF and confirms fetal movement. Next appointment: 09/06/16  O: VSS FHT: 125, moderate with 15x15 accels, no decels Toco: q 3-6 min contractions Cervix: 1/70/-1 per RN Patient given flexeril. She reports her pain 1/10 at this time. She reports that contractions are less intense as well A/P: term  Labor evaluation with back pain and pectoral pain Muscle strain relieved with flexeril DC home  Labor precautions Fetal kick counts Return to MAU as needed

## 2016-09-05 ENCOUNTER — Encounter (HOSPITAL_COMMUNITY): Payer: Self-pay

## 2016-09-05 ENCOUNTER — Inpatient Hospital Stay (HOSPITAL_COMMUNITY): Payer: BC Managed Care – PPO | Admitting: Anesthesiology

## 2016-09-05 ENCOUNTER — Inpatient Hospital Stay (HOSPITAL_COMMUNITY)
Admission: AD | Admit: 2016-09-05 | Discharge: 2016-09-09 | DRG: 765 | Disposition: A | Discharge status: Home / Self Care | Payer: BC Managed Care – PPO | Source: Ambulatory Visit | Attending: Obstetrics and Gynecology | Admitting: Obstetrics and Gynecology

## 2016-09-05 ENCOUNTER — Ambulatory Visit (HOSPITAL_COMMUNITY)
Admission: RE | Admit: 2016-09-05 | Discharge: 2016-09-05 | Disposition: A | Discharge status: Home / Self Care | Payer: BC Managed Care – PPO | Source: Ambulatory Visit | Attending: Obstetrics & Gynecology | Admitting: Obstetrics & Gynecology

## 2016-09-05 ENCOUNTER — Other Ambulatory Visit (HOSPITAL_COMMUNITY): Payer: Self-pay | Admitting: Obstetrics and Gynecology

## 2016-09-05 ENCOUNTER — Other Ambulatory Visit (HOSPITAL_COMMUNITY): Payer: Self-pay | Admitting: Obstetrics & Gynecology

## 2016-09-05 DIAGNOSIS — K5900 Constipation, unspecified: Secondary | ICD-10-CM | POA: Diagnosis not present

## 2016-09-05 DIAGNOSIS — O26843 Uterine size-date discrepancy, third trimester: Secondary | ICD-10-CM | POA: Insufficient documentation

## 2016-09-05 DIAGNOSIS — O9962 Diseases of the digestive system complicating childbirth: Secondary | ICD-10-CM | POA: Diagnosis present

## 2016-09-05 DIAGNOSIS — Z87891 Personal history of nicotine dependence: Secondary | ICD-10-CM | POA: Diagnosis not present

## 2016-09-05 DIAGNOSIS — O26849 Uterine size-date discrepancy, unspecified trimester: Secondary | ICD-10-CM

## 2016-09-05 DIAGNOSIS — O1205 Gestational edema, complicating the puerperium: Secondary | ICD-10-CM | POA: Diagnosis not present

## 2016-09-05 DIAGNOSIS — Z36 Encounter for antenatal screening of mother: Secondary | ICD-10-CM

## 2016-09-05 DIAGNOSIS — K219 Gastro-esophageal reflux disease without esophagitis: Secondary | ICD-10-CM | POA: Diagnosis present

## 2016-09-05 DIAGNOSIS — Z8249 Family history of ischemic heart disease and other diseases of the circulatory system: Secondary | ICD-10-CM

## 2016-09-05 DIAGNOSIS — D62 Acute posthemorrhagic anemia: Secondary | ICD-10-CM | POA: Diagnosis not present

## 2016-09-05 DIAGNOSIS — O9081 Anemia of the puerperium: Secondary | ICD-10-CM | POA: Diagnosis not present

## 2016-09-05 DIAGNOSIS — Z3A39 39 weeks gestation of pregnancy: Secondary | ICD-10-CM

## 2016-09-05 DIAGNOSIS — Z3689 Encounter for other specified antenatal screening: Secondary | ICD-10-CM

## 2016-09-05 DIAGNOSIS — O403XX0 Polyhydramnios, third trimester, not applicable or unspecified: Secondary | ICD-10-CM | POA: Diagnosis present

## 2016-09-05 DIAGNOSIS — Z6841 Body Mass Index (BMI) 40.0 and over, adult: Secondary | ICD-10-CM | POA: Diagnosis not present

## 2016-09-05 DIAGNOSIS — Z98891 History of uterine scar from previous surgery: Secondary | ICD-10-CM

## 2016-09-05 DIAGNOSIS — O99214 Obesity complicating childbirth: Secondary | ICD-10-CM | POA: Diagnosis present

## 2016-09-05 DIAGNOSIS — Z3403 Encounter for supervision of normal first pregnancy, third trimester: Secondary | ICD-10-CM | POA: Diagnosis present

## 2016-09-05 DIAGNOSIS — Z3490 Encounter for supervision of normal pregnancy, unspecified, unspecified trimester: Secondary | ICD-10-CM

## 2016-09-05 DIAGNOSIS — O3663X0 Maternal care for excessive fetal growth, third trimester, not applicable or unspecified: Secondary | ICD-10-CM | POA: Diagnosis present

## 2016-09-05 DIAGNOSIS — O09513 Supervision of elderly primigravida, third trimester: Secondary | ICD-10-CM

## 2016-09-05 HISTORY — DX: Constipation, unspecified: K59.00

## 2016-09-05 HISTORY — DX: Gestational edema, complicating the puerperium: O12.05

## 2016-09-05 LAB — CBC
HCT: 32.8 % — ABNORMAL LOW (ref 36.0–46.0)
Hemoglobin: 11.1 g/dL — ABNORMAL LOW (ref 12.0–15.0)
MCH: 29.6 pg (ref 26.0–34.0)
MCHC: 33.8 g/dL (ref 30.0–36.0)
MCV: 87.5 fL (ref 78.0–100.0)
Platelets: 211 10*3/uL (ref 150–400)
RBC: 3.75 MIL/uL — ABNORMAL LOW (ref 3.87–5.11)
RDW: 13.5 % (ref 11.5–15.5)
WBC: 11.8 10*3/uL — ABNORMAL HIGH (ref 4.0–10.5)

## 2016-09-05 LAB — TYPE AND SCREEN
ABO/RH(D): O POS
Antibody Screen: NEGATIVE

## 2016-09-05 LAB — ABO/RH: ABO/RH(D): O POS

## 2016-09-05 MED ORDER — FENTANYL 2.5 MCG/ML BUPIVACAINE 1/10 % EPIDURAL INFUSION (WH - ANES)
14.0000 mL/h | INTRAMUSCULAR | Status: DC | PRN
  Administered 2016-09-05 – 2016-09-06 (×3): 14 mL/h via EPIDURAL
  Filled 2016-09-05 (×3): qty 125

## 2016-09-05 MED ORDER — OXYTOCIN 40 UNITS IN LACTATED RINGERS INFUSION - SIMPLE MED: 2.5000 [IU]/h | INTRAVENOUS | Status: DC

## 2016-09-05 MED ORDER — EPHEDRINE 5 MG/ML INJ
10.0000 mg | INTRAVENOUS | Status: AC | PRN
  Administered 2016-09-05 (×2): 5 mg via INTRAVENOUS

## 2016-09-05 MED ORDER — ONDANSETRON HCL 4 MG/2ML IJ SOLN: 4.0000 mg | Freq: Four times a day (QID) | INTRAMUSCULAR | Status: DC | PRN

## 2016-09-05 MED ORDER — LACTATED RINGERS IV SOLN
INTRAVENOUS | Status: DC
  Administered 2016-09-05: 18:00:00 via INTRAVENOUS

## 2016-09-05 MED ORDER — OXYCODONE-ACETAMINOPHEN 5-325 MG PO TABS: 2.0000 | ORAL_TABLET | ORAL | Status: DC | PRN

## 2016-09-05 MED ORDER — SOD CITRATE-CITRIC ACID 500-334 MG/5ML PO SOLN
30.0000 mL | ORAL | Status: DC | PRN
  Administered 2016-09-06: 30 mL via ORAL
  Filled 2016-09-05: qty 15

## 2016-09-05 MED ORDER — OXYCODONE-ACETAMINOPHEN 5-325 MG PO TABS: 1.0000 | ORAL_TABLET | ORAL | Status: DC | PRN

## 2016-09-05 MED ORDER — PHENYLEPHRINE 40 MCG/ML (10ML) SYRINGE FOR IV PUSH (FOR BLOOD PRESSURE SUPPORT)
80.0000 ug | PREFILLED_SYRINGE | INTRAVENOUS | Status: DC | PRN
Start: 2016-09-05 — End: 2016-09-06
  Filled 2016-09-05: qty 10

## 2016-09-05 MED ORDER — LACTATED RINGERS IV SOLN: 500.0000 mL | INTRAVENOUS | Status: DC | PRN

## 2016-09-05 MED ORDER — FAMOTIDINE IN NACL 20-0.9 MG/50ML-% IV SOLN
20.0000 mg | Freq: Two times a day (BID) | INTRAVENOUS | Status: DC
  Administered 2016-09-05 – 2016-09-06 (×2): 20 mg via INTRAVENOUS
  Filled 2016-09-05 (×2): qty 50

## 2016-09-05 MED ORDER — LIDOCAINE HCL (PF) 1 % IJ SOLN: 30.0000 mL | INTRAMUSCULAR | Status: DC | PRN

## 2016-09-05 MED ORDER — EPHEDRINE 5 MG/ML INJ
10.0000 mg | INTRAVENOUS | Status: DC | PRN
  Administered 2016-09-05: 5 mg via INTRAVENOUS
  Filled 2016-09-05: qty 4

## 2016-09-05 MED ORDER — ACETAMINOPHEN 325 MG PO TABS: 650.0000 mg | ORAL_TABLET | ORAL | Status: DC | PRN

## 2016-09-05 MED ORDER — PHENYLEPHRINE 40 MCG/ML (10ML) SYRINGE FOR IV PUSH (FOR BLOOD PRESSURE SUPPORT)
80.0000 ug | PREFILLED_SYRINGE | INTRAVENOUS | Status: DC | PRN
Start: 2016-09-05 — End: 2016-09-06
  Administered 2016-09-05 (×2): 80 ug via INTRAVENOUS

## 2016-09-05 MED ORDER — LACTATED RINGERS IV SOLN: 500.0000 mL | Freq: Once | INTRAVENOUS | Status: DC

## 2016-09-05 MED ORDER — DIPHENHYDRAMINE HCL 50 MG/ML IJ SOLN: 12.5000 mg | INTRAMUSCULAR | Status: DC | PRN

## 2016-09-05 MED ORDER — LIDOCAINE HCL (PF) 1 % IJ SOLN
INTRAMUSCULAR | Status: DC | PRN
  Administered 2016-09-05 (×2): 4 mL

## 2016-09-05 MED ORDER — OXYTOCIN BOLUS FROM INFUSION: 500.0000 mL | Freq: Once | INTRAVENOUS | Status: DC

## 2016-09-05 NOTE — H&P (Signed)
Jeanette [name] is a 35 y.o. female presenting for early labor. OB History    Gravida Para Term Preterm AB Living   1             SAB TAB Ectopic Multiple Live Births                 Past Medical History:  Diagnosis Date  . GERD (gastroesophageal reflux disease)   . Seasonal allergies    Past Surgical History:  Procedure Laterality Date  . NO PAST SURGERIES    . WISDOM TOOTH EXTRACTION     Family History: family history includes Cancer in her maternal grandfather, maternal uncle, and paternal aunt; Heart disease in her father and mother; Hypertension in her father and mother. Social History:  reports that she quit smoking about 15 years ago. She has never used smokeless tobacco. She reports that she drinks alcohol. She reports that she does not use drugs.     Maternal Diabetes: No Genetic Screening: Normal Maternal Ultrasounds/Referrals: Normal Fetal Ultrasounds or other Referrals:  None Maternal Substance Abuse:  No Significant Maternal Medications:  None Significant Maternal Lab Results:  None Other Comments:  None  Review of Systems  Constitutional: Negative.   All other systems reviewed and are negative.  Maternal Medical History:  Reason for admission: Contractions.   Contractions: Onset was 1-2 hours ago.   Frequency: irregular.   Perceived severity is mild.    Fetal activity: Perceived fetal activity is normal.   Last perceived fetal movement was within the past hour.    Prenatal complications: Polyhydramnios.   Prenatal Complications - Diabetes: none.    Dilation: 6 Effacement (%): 100 Station: -3 Exam by:: JDaley Blood pressure (!) 95/49, pulse (!) 105, temperature 98.7 F (37.1 C), temperature source Oral, resp. rate 18, height 5\' 2"  (1.575 m), weight 102.1 kg (225 lb), last menstrual period 09/08/2015, SpO2 98 %. Maternal Exam:  Uterine Assessment: Contraction strength is moderate.  Contraction frequency is irregular.   Abdomen: Patient reports  no abdominal tenderness. Fetal presentation: vertex  Introitus: Normal vulva. Normal vagina.  Ferning test: not done.  Nitrazine test: not done. Amniotic fluid character: not assessed.  Pelvis: of concern for delivery.   Cervix: Cervix evaluated by digital exam.     Physical Exam  Nursing note and vitals reviewed. Constitutional: She is oriented to person, place, and time. She appears well-developed and well-nourished.  HENT:  Head: Normocephalic and atraumatic.  Neck: Normal range of motion. Neck supple.  Cardiovascular: Normal rate and regular rhythm.   Respiratory: Effort normal and breath sounds normal.  GI: Soft. Bowel sounds are normal.  Genitourinary: Vagina normal and uterus normal.  Musculoskeletal: Normal range of motion.  Neurological: She is alert and oriented to person, place, and time.  Skin: Skin is warm and dry.  Psychiatric: She has a normal mood and affect.    Prenatal labs: ABO, Rh: --/--/O POS, O POS (09/27 1700) Antibody: NEG (09/27 1700) Rubella: Immune (02/21 0000) RPR: Nonreactive (02/21 0000)  HBsAg: Negative (02/21 0000)  HIV: Non-reactive (02/21 0000)  GBS: Negative (09/06 0000)   Assessment/Plan: 39 + weeks Early labor with floating vertex  Presumed LGA Admit  SROM when vertex bgetter applied [name] J 09/05/2016, 9:26 PM

## 2016-09-05 NOTE — Anesthesia Procedure Notes (Signed)
Epidural Patient location during procedure: OB  Staffing Anesthesiologist: Lauretta Grill Performed: anesthesiologist   Preanesthetic Checklist Completed: patient identified, site marked, surgical consent, pre-op evaluation, timeout performed, IV checked, risks and benefits discussed and monitors and equipment checked  Epidural Patient position: sitting Prep: site prepped and draped and DuraPrep Patient monitoring: continuous pulse ox and blood pressure Approach: midline Location: L3-L4 Injection technique: LOR saline  Needle:  Needle type: Tuohy  Needle gauge: 17 G Needle length: 9 cm and 9 Needle insertion depth: 6 cm Catheter type: closed end flexible Catheter size: 19 Gauge Catheter at skin depth: 10 cm Test dose: negative  Assessment Events: blood not aspirated, injection not painful, no injection resistance, negative IV test and no paresthesia  Additional Notes Patient identified. Risks/Benefits/Options discussed with patient including but not limited to bleeding, infection, nerve damage, paralysis, failed block, incomplete pain control, headache, blood pressure changes, nausea, vomiting, reactions to medication both or allergic, itching and postpartum back pain. Confirmed with bedside nurse the patient's most recent platelet count. Confirmed with patient that they are not currently taking any anticoagulation, have any bleeding history or any family history of bleeding disorders. Patient expressed understanding and wished to proceed. All questions were answered. Sterile technique was used throughout the entire procedure. Please see nursing notes for vital signs. Test dose was given through epidural catheter and negative prior to continuing to dose epidural or start infusion. Warning signs of high block given to the patient including shortness of breath, tingling/numbness in hands, complete motor block, or any concerning symptoms with instructions to call for help. Patient was given  instructions on fall risk and not to get out of bed. All questions and concerns addressed with instructions to call with any issues or inadequate analgesia.

## 2016-09-05 NOTE — MAU Note (Signed)
Patient presents with ctx every 3-4 mins. Patient denies and bleeding or LOF. Fetus active.

## 2016-09-05 NOTE — Anesthesia Pain Management Evaluation Note (Signed)
  CRNA Pain Management Visit Note  Patient: Jeanette [name], 35 y.o., female  "Hello I am a member of the anesthesia team at [facility]. We have an anesthesia team available at all times to provide care throughout the hospital, including epidural management and anesthesia for C-section. I don't know your plan for the delivery whether it a natural birth, water birth, IV sedation, nitrous supplementation, doula or epidural, but we want to meet your pain goals."   1.Was your pain managed to your expectations on prior hospitalizations?   No prior hospitalizations  2.What is your expectation for pain management during this hospitalization?     Epidural  3.How can we help you reach that goal? Maintain epidural during labor & delivery.  Record the patient's initial score and the patient's pain goal.   Pain: 0  Pain Goal: 3 [facility] wants you to be able to say your pain was always managed very well.  Jeanette [name] 09/05/2016

## 2016-09-05 NOTE — Anesthesia Preprocedure Evaluation (Addendum)
Anesthesia Evaluation  Patient identified by MRN, date of birth, ID band Patient awake    Reviewed: Allergy & Precautions, NPO status , Patient's Chart, lab work & pertinent test results  History of Anesthesia Complications Negative for: history of anesthetic complications  Airway Mallampati: II  TM Distance: >3 FB Neck ROM: Full    Dental no notable dental hx. (+) Dental Advisory Given   Pulmonary former smoker,    Pulmonary exam normal breath sounds clear to auscultation       Cardiovascular negative cardio ROS Normal cardiovascular exam Rhythm:Regular Rate:Normal     Neuro/Psych negative neurological ROS  negative psych ROS   GI/Hepatic Neg liver ROS, GERD  Controlled and Medicated,  Endo/Other  Morbid obesity  Renal/GU negative Renal ROS  negative genitourinary   Musculoskeletal negative musculoskeletal ROS (+)   Abdominal (+) + obese,   Peds negative pediatric ROS (+)  Hematology  (+) anemia ,   Anesthesia Other Findings   Reproductive/Obstetrics negative OB ROS                            Anesthesia Physical Anesthesia Plan  ASA: III and emergent  Anesthesia Plan: Epidural   Post-op Pain Management:    Induction:   Airway Management Planned:   Additional Equipment:   Intra-op Plan:   Post-operative Plan:   Informed Consent: I have reviewed the patients History and Physical, chart, labs and discussed the procedure including the risks, benefits and alternatives for the proposed anesthesia with the patient or authorized representative who has indicated his/her understanding and acceptance.   Dental advisory given  Plan Discussed with: CRNA, Anesthesiologist and Surgeon  Anesthesia Plan Comments: (Patient for urgent C/Section for failure to progress. Will use epidural for C/Section. M. Terea Neubauer,MD)       Anesthesia Quick Evaluation

## 2016-09-05 NOTE — Progress Notes (Signed)
Jeanette [name] is a 35 y.o. G1P0 at [redacted]w[redacted]d by LMP admitted for active labor  Subjective:   Objective: BP (!) 95/49   Pulse (!) 105   Temp 98.7 F (37.1 C) (Oral)   Resp 18   Ht 5\' 2"  (1.575 m)   Wt 102.1 kg (225 lb)   LMP 09/08/2015   SpO2 98%   BMI 41.15 kg/m  No intake/output data recorded. No intake/output data recorded.  FHT:  FHR: 145 bpm, variability: moderate,  accelerations:  Present,  decelerations:  Absent UC:   regular, every 3-4 minutes SVE:   Dilation: 6 Effacement (%): 100 Station: -3 Exam by:: JDaley  Labs: Lab Results  Component Value Date   WBC 11.8 (H) 09/05/2016   HGB 11.1 (L) 09/05/2016   HCT 32.8 (L) 09/05/2016   MCV 87.5 09/05/2016   PLT 211 09/05/2016    Assessment / Plan: Early labor with floating vertex Presumed LGA  Labor: Progressing normally Preeclampsia:  no signs or symptoms of toxicity Fetal Wellbeing:  Category I Pain Control:  Epidural I/D:  n/a Anticipated MOD:  guarded  Ardis Fullwood J 09/05/2016, 9:29 PM

## 2016-09-06 ENCOUNTER — Encounter (HOSPITAL_COMMUNITY): Payer: Self-pay | Admitting: *Deleted

## 2016-09-06 ENCOUNTER — Encounter (HOSPITAL_COMMUNITY)
Admission: AD | Disposition: A | Discharge status: Home / Self Care | Payer: Self-pay | Source: Ambulatory Visit | Attending: Obstetrics and Gynecology

## 2016-09-06 DIAGNOSIS — Z98891 History of uterine scar from previous surgery: Secondary | ICD-10-CM

## 2016-09-06 LAB — RPR: RPR Ser Ql: NONREACTIVE

## 2016-09-06 SURGERY — Surgical Case
Anesthesia: Epidural

## 2016-09-06 MED ORDER — TETANUS-DIPHTH-ACELL PERTUSSIS 5-2.5-18.5 LF-MCG/0.5 IM SUSP
0.5000 mL | Freq: Once | INTRAMUSCULAR | Status: AC
  Administered 2016-09-07: 0.5 mL via INTRAMUSCULAR

## 2016-09-06 MED ORDER — OXYTOCIN 10 UNIT/ML IJ SOLN
INTRAVENOUS | Status: DC | PRN
  Administered 2016-09-06: 40 [IU] via INTRAVENOUS

## 2016-09-06 MED ORDER — CEFAZOLIN SODIUM-DEXTROSE 2-3 GM-% IV SOLR
INTRAVENOUS | Status: DC | PRN
  Administered 2016-09-06: 2 g via INTRAVENOUS

## 2016-09-06 MED ORDER — LACTATED RINGERS IV SOLN
INTRAVENOUS | Status: DC | PRN
  Administered 2016-09-06: 09:00:00 via INTRAVENOUS

## 2016-09-06 MED ORDER — DIPHENHYDRAMINE HCL 25 MG PO CAPS
25.0000 mg | ORAL_CAPSULE | ORAL | Status: DC | PRN
  Filled 2016-09-06: qty 1

## 2016-09-06 MED ORDER — OXYCODONE-ACETAMINOPHEN 5-325 MG PO TABS
1.0000 | ORAL_TABLET | ORAL | Status: DC | PRN
Start: 2016-09-06 — End: 2016-09-09
  Administered 2016-09-07 – 2016-09-09 (×9): 1 via ORAL
  Filled 2016-09-06 (×9): qty 1

## 2016-09-06 MED ORDER — NALOXONE HCL 2 MG/2ML IJ SOSY
1.0000 ug/kg/h | PREFILLED_SYRINGE | INTRAMUSCULAR | Status: DC | PRN
  Filled 2016-09-06: qty 2

## 2016-09-06 MED ORDER — IBUPROFEN 600 MG PO TABS: 600.0000 mg | ORAL_TABLET | Freq: Four times a day (QID) | ORAL | Status: DC | PRN

## 2016-09-06 MED ORDER — CEFAZOLIN SODIUM-DEXTROSE 2-4 GM/100ML-% IV SOLN
INTRAVENOUS | Status: AC
  Filled 2016-09-06: qty 100

## 2016-09-06 MED ORDER — SIMETHICONE 80 MG PO CHEW
80.0000 mg | CHEWABLE_TABLET | ORAL | Status: DC
  Administered 2016-09-07 – 2016-09-09 (×3): 80 mg via ORAL
  Filled 2016-09-06 (×3): qty 1

## 2016-09-06 MED ORDER — OXYCODONE-ACETAMINOPHEN 5-325 MG PO TABS
2.0000 | ORAL_TABLET | ORAL | Status: DC | PRN
  Administered 2016-09-07: 2 via ORAL
  Filled 2016-09-06: qty 2

## 2016-09-06 MED ORDER — ZOLPIDEM TARTRATE 5 MG PO TABS: 5.0000 mg | ORAL_TABLET | Freq: Every evening | ORAL | Status: DC | PRN

## 2016-09-06 MED ORDER — METHYLERGONOVINE MALEATE 0.2 MG PO TABS: 0.2000 mg | ORAL_TABLET | ORAL | Status: DC | PRN

## 2016-09-06 MED ORDER — NALBUPHINE HCL 10 MG/ML IJ SOLN
5.0000 mg | INTRAMUSCULAR | Status: DC | PRN
  Administered 2016-09-06 – 2016-09-07 (×2): 5 mg via INTRAVENOUS
  Filled 2016-09-06 (×2): qty 1

## 2016-09-06 MED ORDER — MENTHOL 3 MG MT LOZG: 1.0000 | LOZENGE | OROMUCOSAL | Status: DC | PRN

## 2016-09-06 MED ORDER — KETOROLAC TROMETHAMINE 30 MG/ML IJ SOLN
30.0000 mg | Freq: Four times a day (QID) | INTRAMUSCULAR | Status: AC | PRN
Start: 2016-09-06 — End: 2016-09-07
  Administered 2016-09-06: 30 mg via INTRAMUSCULAR

## 2016-09-06 MED ORDER — ONDANSETRON HCL 4 MG/2ML IJ SOLN
INTRAMUSCULAR | Status: DC | PRN
  Administered 2016-09-06: 4 mg via INTRAVENOUS

## 2016-09-06 MED ORDER — DIPHENHYDRAMINE HCL 50 MG/ML IJ SOLN: 12.5000 mg | INTRAMUSCULAR | Status: DC | PRN

## 2016-09-06 MED ORDER — LACTATED RINGERS IV SOLN
INTRAVENOUS | Status: DC | PRN
Start: 2016-09-06 — End: 2016-09-06
  Administered 2016-09-06 (×2): via INTRAVENOUS

## 2016-09-06 MED ORDER — SENNOSIDES-DOCUSATE SODIUM 8.6-50 MG PO TABS
2.0000 | ORAL_TABLET | ORAL | Status: DC
  Administered 2016-09-07 – 2016-09-09 (×3): 2 via ORAL
  Filled 2016-09-06 (×3): qty 2

## 2016-09-06 MED ORDER — NALBUPHINE HCL 10 MG/ML IJ SOLN: 5.0000 mg | INTRAMUSCULAR | Status: DC | PRN

## 2016-09-06 MED ORDER — TERBUTALINE SULFATE 1 MG/ML IJ SOLN
0.2500 mg | Freq: Once | INTRAMUSCULAR | Status: AC
  Administered 2016-09-06: 0.25 mg via SUBCUTANEOUS

## 2016-09-06 MED ORDER — FENTANYL CITRATE (PF) 100 MCG/2ML IJ SOLN
INTRAMUSCULAR | Status: AC
  Filled 2016-09-06: qty 2

## 2016-09-06 MED ORDER — TERBUTALINE SULFATE 1 MG/ML IJ SOLN
INTRAMUSCULAR | Status: AC
  Filled 2016-09-06: qty 1

## 2016-09-06 MED ORDER — BUPIVACAINE HCL (PF) 0.25 % IJ SOLN
INTRAMUSCULAR | Status: AC
  Filled 2016-09-06: qty 30

## 2016-09-06 MED ORDER — MORPHINE SULFATE (PF) 0.5 MG/ML IJ SOLN
INTRAMUSCULAR | Status: DC | PRN
Start: 2016-09-06 — End: 2016-09-06
  Administered 2016-09-06: 3 mg via EPIDURAL

## 2016-09-06 MED ORDER — BUPIVACAINE HCL (PF) 0.25 % IJ SOLN
INTRAMUSCULAR | Status: AC
  Filled 2016-09-06: qty 10

## 2016-09-06 MED ORDER — SODIUM BICARBONATE 8.4 % IV SOLN
INTRAVENOUS | Status: DC | PRN
  Administered 2016-09-06: 5 mL via EPIDURAL
  Administered 2016-09-06: 1 mL via EPIDURAL
  Administered 2016-09-06 (×2): 2 mL via EPIDURAL
  Administered 2016-09-06 (×2): 3 mL via EPIDURAL

## 2016-09-06 MED ORDER — SIMETHICONE 80 MG PO CHEW
80.0000 mg | CHEWABLE_TABLET | Freq: Three times a day (TID) | ORAL | Status: DC
  Administered 2016-09-07 – 2016-09-09 (×5): 80 mg via ORAL
  Filled 2016-09-06 (×6): qty 1

## 2016-09-06 MED ORDER — DIBUCAINE 1 % RE OINT: 1.0000 "application " | TOPICAL_OINTMENT | RECTAL | Status: DC | PRN

## 2016-09-06 MED ORDER — NALBUPHINE HCL 10 MG/ML IJ SOLN: 5.0000 mg | Freq: Once | INTRAMUSCULAR | Status: DC | PRN

## 2016-09-06 MED ORDER — COCONUT OIL OIL
1.0000 "application " | TOPICAL_OIL | Status: DC | PRN
  Administered 2016-09-08: 1 via TOPICAL
  Filled 2016-09-06: qty 120

## 2016-09-06 MED ORDER — PRENATAL MULTIVITAMIN CH
1.0000 | ORAL_TABLET | Freq: Every day | ORAL | Status: DC
  Administered 2016-09-07 – 2016-09-09 (×3): 1 via ORAL
  Filled 2016-09-06 (×3): qty 1

## 2016-09-06 MED ORDER — METHYLERGONOVINE MALEATE 0.2 MG/ML IJ SOLN: 0.2000 mg | INTRAMUSCULAR | Status: DC | PRN

## 2016-09-06 MED ORDER — NALOXONE HCL 0.4 MG/ML IJ SOLN: 0.4000 mg | INTRAMUSCULAR | Status: DC | PRN

## 2016-09-06 MED ORDER — MEPERIDINE HCL 25 MG/ML IJ SOLN: 6.2500 mg | INTRAMUSCULAR | Status: DC | PRN

## 2016-09-06 MED ORDER — LACTATED RINGERS IV SOLN
INTRAVENOUS | Status: DC
  Administered 2016-09-06 – 2016-09-07 (×3): via INTRAVENOUS

## 2016-09-06 MED ORDER — SODIUM BICARBONATE 8.4 % IV SOLN
INTRAVENOUS | Status: AC
  Filled 2016-09-06: qty 50

## 2016-09-06 MED ORDER — ACETAMINOPHEN 325 MG PO TABS: 650.0000 mg | ORAL_TABLET | ORAL | Status: DC | PRN

## 2016-09-06 MED ORDER — FENTANYL CITRATE (PF) 100 MCG/2ML IJ SOLN
25.0000 ug | INTRAMUSCULAR | Status: DC | PRN
  Administered 2016-09-06: 50 ug via INTRAVENOUS
  Administered 2016-09-06: 25 ug via INTRAVENOUS

## 2016-09-06 MED ORDER — KETOROLAC TROMETHAMINE 30 MG/ML IJ SOLN
30.0000 mg | Freq: Four times a day (QID) | INTRAMUSCULAR | Status: AC | PRN
  Administered 2016-09-06: 30 mg via INTRAVENOUS
  Filled 2016-09-06: qty 1

## 2016-09-06 MED ORDER — IBUPROFEN 600 MG PO TABS
600.0000 mg | ORAL_TABLET | Freq: Four times a day (QID) | ORAL | Status: DC
  Administered 2016-09-07 – 2016-09-09 (×11): 600 mg via ORAL
  Filled 2016-09-06 (×11): qty 1

## 2016-09-06 MED ORDER — DIPHENHYDRAMINE HCL 25 MG PO CAPS
25.0000 mg | ORAL_CAPSULE | Freq: Four times a day (QID) | ORAL | Status: DC | PRN
Start: 2016-09-06 — End: 2016-09-09

## 2016-09-06 MED ORDER — ONDANSETRON HCL 4 MG/2ML IJ SOLN
INTRAMUSCULAR | Status: AC
  Filled 2016-09-06: qty 2

## 2016-09-06 MED ORDER — SIMETHICONE 80 MG PO CHEW
80.0000 mg | CHEWABLE_TABLET | ORAL | Status: DC | PRN
  Filled 2016-09-06: qty 1

## 2016-09-06 MED ORDER — ONDANSETRON HCL 4 MG/2ML IJ SOLN: 4.0000 mg | Freq: Three times a day (TID) | INTRAMUSCULAR | Status: DC | PRN

## 2016-09-06 MED ORDER — SODIUM CHLORIDE 0.9% FLUSH: 3.0000 mL | INTRAVENOUS | Status: DC | PRN

## 2016-09-06 MED ORDER — KETOROLAC TROMETHAMINE 30 MG/ML IJ SOLN
INTRAMUSCULAR | Status: AC
  Administered 2016-09-06: 30 mg via INTRAVENOUS
  Filled 2016-09-06: qty 1

## 2016-09-06 MED ORDER — WITCH HAZEL-GLYCERIN EX PADS
1.0000 "application " | MEDICATED_PAD | CUTANEOUS | Status: DC | PRN
  Administered 2016-09-06: 1 via TOPICAL

## 2016-09-06 MED ORDER — LIDOCAINE-EPINEPHRINE (PF) 2 %-1:200000 IJ SOLN
INTRAMUSCULAR | Status: AC
  Filled 2016-09-06: qty 20

## 2016-09-06 MED ORDER — MORPHINE SULFATE (PF) 0.5 MG/ML IJ SOLN
INTRAMUSCULAR | Status: AC
  Filled 2016-09-06: qty 10

## 2016-09-06 MED ORDER — ACETAMINOPHEN 500 MG PO TABS
1000.0000 mg | ORAL_TABLET | Freq: Four times a day (QID) | ORAL | Status: AC
  Administered 2016-09-06: 1000 mg via ORAL
  Filled 2016-09-06 (×2): qty 2

## 2016-09-06 MED ORDER — OXYTOCIN 40 UNITS IN LACTATED RINGERS INFUSION - SIMPLE MED: 2.5000 [IU]/h | INTRAVENOUS | Status: AC

## 2016-09-06 MED ORDER — BUPIVACAINE HCL (PF) 0.25 % IJ SOLN
INTRAMUSCULAR | Status: DC | PRN
  Administered 2016-09-06: 20 mL

## 2016-09-06 SURGICAL SUPPLY — 38 items
APL SKNCLS STERI-STRIP NONHPOA (GAUZE/BANDAGES/DRESSINGS) ×1
BENZOIN TINCTURE PRP APPL 2/3 (GAUZE/BANDAGES/DRESSINGS) ×1 IMPLANT
CHLORAPREP W/TINT 26ML (MISCELLANEOUS) ×2 IMPLANT
CLAMP CORD UMBIL (MISCELLANEOUS) IMPLANT
CLOTH BEACON ORANGE TIMEOUT ST (SAFETY) ×2 IMPLANT
CONTAINER PREFILL 10% NBF 15ML (MISCELLANEOUS) IMPLANT
DECANTER SPIKE VIAL GLASS SM (MISCELLANEOUS) ×1 IMPLANT
DRSG OPSITE POSTOP 4X10 (GAUZE/BANDAGES/DRESSINGS) ×2 IMPLANT
ELECT REM PT RETURN 9FT ADLT (ELECTROSURGICAL) ×2
ELECTRODE REM PT RTRN 9FT ADLT (ELECTROSURGICAL) ×1 IMPLANT
EXTRACTOR VACUUM M CUP 4 TUBE (SUCTIONS) IMPLANT
GLOVE BIO SURGEON STRL SZ7.5 (GLOVE) ×2 IMPLANT
GLOVE BIOGEL PI IND STRL 7.0 (GLOVE) ×1 IMPLANT
GLOVE BIOGEL PI INDICATOR 7.0 (GLOVE) ×1
GOWN STRL REUS W/TWL LRG LVL3 (GOWN DISPOSABLE) ×4 IMPLANT
KIT ABG SYR 3ML LUER SLIP (SYRINGE) IMPLANT
NDL HYPO 25X5/8 SAFETYGLIDE (NEEDLE) IMPLANT
NDL SPNL 20GX3.5 QUINCKE YW (NEEDLE) IMPLANT
NEEDLE HYPO 22GX1.5 SAFETY (NEEDLE) ×3 IMPLANT
NEEDLE HYPO 25X5/8 SAFETYGLIDE (NEEDLE) IMPLANT
NEEDLE SPNL 20GX3.5 QUINCKE YW (NEEDLE) IMPLANT
NS IRRIG 1000ML POUR BTL (IV SOLUTION) ×2 IMPLANT
PACK C SECTION WH (CUSTOM PROCEDURE TRAY) ×2 IMPLANT
PENCIL SMOKE EVAC W/HOLSTER (ELECTROSURGICAL) ×2 IMPLANT
STRIP CLOSURE SKIN 1/2X4 (GAUZE/BANDAGES/DRESSINGS) ×1 IMPLANT
SUT MNCRL 0 VIOLET CTX 36 (SUTURE) ×2 IMPLANT
SUT MNCRL AB 3-0 PS2 27 (SUTURE) IMPLANT
SUT MON AB 2-0 CT1 27 (SUTURE) ×2 IMPLANT
SUT MON AB-0 CT1 36 (SUTURE) ×4 IMPLANT
SUT MONOCRYL 0 CTX 36 (SUTURE) ×2
SUT PLAIN 0 NONE (SUTURE) IMPLANT
SUT PLAIN 2 0 (SUTURE)
SUT PLAIN 2 0 XLH (SUTURE) ×1 IMPLANT
SUT PLAIN ABS 2-0 CT1 27XMFL (SUTURE) IMPLANT
SYR 20CC LL (SYRINGE) IMPLANT
SYR CONTROL 10ML LL (SYRINGE) ×3 IMPLANT
TOWEL OR 17X24 6PK STRL BLUE (TOWEL DISPOSABLE) ×2 IMPLANT
TRAY FOLEY CATH SILVER 14FR (SET/KITS/TRAYS/PACK) ×2 IMPLANT

## 2016-09-06 NOTE — Op Note (Signed)
Cesarean Section Procedure Note  Indications: failure to progress: arrest of dilation  Pre-operative Diagnosis: 39 week 5 day pregnancy.  Post-operative Diagnosis: same  Surgeon: Lovenia Kim   Assistants: Eddie Dibbles, CNM  Anesthesia: Epidural anesthesia and Local anesthesia 0.25.% bupivacaine  ASA Class: 2  Procedure Details  The patient was seen in the Holding Room. The risks, benefits, complications, treatment options, and expected outcomes were discussed with the patient.  The patient concurred with the proposed plan, giving informed consent. The risks of anesthesia, infection, bleeding and possible injury to other organs discussed. Injury to bowel, bladder, or ureter with possible need for repair discussed. Possible need for transfusion with secondary risks of hepatitis or HIV acquisition discussed. Post operative complications to include but not limited to DVT, PE and Pneumonia noted. The site of surgery properly noted/marked. The patient was taken to Operating Room # 9, identified as Jeanette [name] and the procedure verified as C-Section Delivery. A Time Out was held and the above information confirmed.  After induction of anesthesia, the patient was draped and prepped in the usual sterile manner. A Pfannenstiel incision was made and carried down through the subcutaneous tissue to the fascia. Fascial incision was made and extended transversely using Mayo scissors. The fascia was separated from the underlying rectus tissue superiorly and inferiorly. The peritoneum was identified and entered. Peritoneal incision was extended longitudinally. The utero-vesical peritoneal reflection was incised transversely and the bladder flap was bluntly freed from the lower uterine segment. A low transverse uterine incision(Kerr hysterotomy) was made. Delivered from OP presentation was a  female with Apgar scores of 8 at one minute and 9 at five minutes. Bulb suctioning gently performed. Neonatal team in  attendance.After the umbilical cord was clamped and cut cord blood was obtained for evaluation. The placenta was removed intact and appeared normal. The uterus was curetted with a dry lap pack. Good hemostasis was noted.The uterine outline, tubes and ovaries appeared normal. The uterine incision was closed with running locked sutures of 0 Monocryl x 2 layers. Hemostasis was observed. .The parietal peritoneum was closed with a running 2-0 Monocryl suture. The fascia was then reapproximated with running sutures of 0 Monocryl. The skin was reapproximated with 3-0 monocryl after [place] closure with 2-0 plain.  Instrument, sponge, and needle counts were correct prior the abdominal closure and at the conclusion of the case.   Findings: FTLM, post placenta, OP, Partubal cyst left  Estimated Blood Loss:  500         Drains: foley                 Specimens: placenta                 Complications:  None; patient tolerated the procedure well.         Disposition: PACU - hemodynamically stable.         Condition: stable  Attending Attestation: I performed the procedure.

## 2016-09-06 NOTE — Anesthesia Postprocedure Evaluation (Signed)
Anesthesia Post Note  Patient: Jeanette [name]  Procedure(s) Performed: Procedure(s) (LRB): CESAREAN SECTION (N/A)  Patient location during evaluation: Mother Baby Anesthesia Type: Epidural Level of consciousness: awake Pain management: satisfactory to patient Vital Signs Assessment: post-procedure vital signs reviewed and stable Respiratory status: spontaneous breathing Cardiovascular status: stable Anesthetic complications: no     Last Vitals:  Vitals:   09/06/16 1400 09/06/16 1500  BP: 113/66 114/62  Pulse: 69 70  Resp: 16 18  Temp: 36.6 C 36.6 C    Last Pain:  Vitals:   09/06/16 1500  TempSrc: Oral  PainSc:    Pain Goal: Patients Stated Pain Goal: 0 (09/05/16 2000)               Casimer Lanius

## 2016-09-06 NOTE — Transfer of Care (Signed)
Immediate Anesthesia Transfer of Care Note  Patient: Jeanette [name]  Procedure(s) Performed: Procedure(s): CESAREAN SECTION (N/A)  Patient Location: PACU  Anesthesia Type:Epidural  Level of Consciousness: awake, alert , oriented and patient cooperative  Airway & Oxygen Therapy: Patient Spontanous Breathing  Post-op Assessment: Report given to RN and Post -op Vital signs reviewed and stable  Post vital signs: Reviewed and stable  Last Vitals:  Vitals:   09/06/16 0730 09/06/16 0831  BP: (!) 113/59 127/62  Pulse: 86 (!) 104  Resp:    Temp:      Last Pain:  Vitals:   09/06/16 0601  TempSrc: Oral  PainSc:       Patients Stated Pain Goal: 0 ([id number])  Complications: No apparent anesthesia complications

## 2016-09-06 NOTE — Addendum Note (Signed)
Addendum  created 09/06/16 1804 by Asher Muir, CRNA   Sign clinical note

## 2016-09-06 NOTE — Anesthesia Postprocedure Evaluation (Signed)
Anesthesia Post Note  Patient: Jeanette [name]  Procedure(s) Performed: Procedure(s) (LRB): CESAREAN SECTION (N/A)  Patient location during evaluation: PACU Anesthesia Type: Epidural Level of consciousness: awake and alert and oriented Pain management: pain level controlled Vital Signs Assessment: post-procedure vital signs reviewed and stable Respiratory status: spontaneous breathing, nonlabored ventilation and respiratory function stable Cardiovascular status: blood pressure returned to baseline and stable Postop Assessment: no signs of nausea or vomiting, no headache, no backache, epidural receding and patient able to bend at knees Anesthetic complications: no     Last Vitals:  Vitals:   09/06/16 1045 09/06/16 1100  BP: 121/80 136/72  Pulse: 84 88  Resp: 19 16  Temp:      Last Pain:  Vitals:   09/06/16 1100  TempSrc:   PainSc: 2    Pain Goal: Patients Stated Pain Goal: 0 (09/05/16 2000)               Ladora Osterberg A.

## 2016-09-06 NOTE — Progress Notes (Signed)
Jeanette [name] is a 35 y.o. G1P0 at [redacted]w[redacted]d by LMP admitted for active labor  Subjective: comfortable  Objective: BP 110/67   Pulse 96   Temp 99.7 F (37.6 C) (Oral)   Resp 18   Ht 5\' 2"  (1.575 m)   Wt 102.1 kg (225 lb)   LMP 09/08/2015   SpO2 98%   BMI 41.15 kg/m  I/O last 3 completed shifts: In: -  Out: 650 [Urine:650] No intake/output data recorded.  FHT:  FHR: 160 bpm, variability: moderate,  accelerations:  Present,  decelerations:  Absent UC:   regular, every 2-3 minutes SVE:   4-5/edematous/-2  Labs: Lab Results  Component Value Date   WBC 11.8 (H) 09/05/2016   HGB 11.1 (L) 09/05/2016   HCT 32.8 (L) 09/05/2016   MCV 87.5 09/05/2016   PLT 211 09/05/2016    Assessment / Plan: Arrest in active phase of labor  Pt decline IUPC and augmentation  Labor: no progress Preeclampsia:  no signs or symptoms of toxicity Fetal Wellbeing:  Category I Pain Control:  Epidural I/D:  n/a Anticipated MOD:  Proceeds with csection. Risks vs benefits discussed. Consent done. OR notified.  Tvisha Schwoerer J 09/06/2016, 8:26 AM

## 2016-09-06 NOTE — Progress Notes (Signed)
Jeanette [name] is a 35 y.o. G1P0 at [redacted]w[redacted]d by LMP admitted for active labor  Subjective: SROM around MN Clear AF Comfortable with   Objective: BP (!) 123/58   Pulse 96   Temp 99.2 F (37.3 C) (Oral)   Resp 18   Ht 5\' 2"  (1.575 m)   Wt 102.1 kg (225 lb)   LMP 09/08/2015   SpO2 98%   BMI 41.15 kg/m  No intake/output data recorded. No intake/output data recorded.  FHT:  FHR: 150 bpm, variability: moderate,  accelerations:  Present,  decelerations:  Absent UC:   regular, every 3-4 minutes SVE:   Dilation: 4.5 Effacement (%): 80 Station: -2 Exam by:: JDaley  Labs: Lab Results  Component Value Date   WBC 11.8 (H) 09/05/2016   HGB 11.1 (L) 09/05/2016   HCT 32.8 (L) 09/05/2016   MCV 87.5 09/05/2016   PLT 211 09/05/2016    Assessment / Plan: Protracted latent phase  Admitted due to discomfort in early prodromal labor History of LGA on sono- declined consideration of csection  Labor: Will add IUPC this am Preeclampsia:  no signs or symptoms of toxicity Fetal Wellbeing:  Category I Pain Control:  Epidural I/D:  n/a Anticipated MOD:  guarded  Jeanette [name] J 09/06/2016, 6:12 AM

## 2016-09-07 ENCOUNTER — Ambulatory Visit (HOSPITAL_COMMUNITY): Payer: Self-pay

## 2016-09-07 ENCOUNTER — Ambulatory Visit (HOSPITAL_COMMUNITY): Payer: BC Managed Care – PPO

## 2016-09-07 LAB — CBC
HCT: 28.9 % — ABNORMAL LOW (ref 36.0–46.0)
Hemoglobin: 9.7 g/dL — ABNORMAL LOW (ref 12.0–15.0)
MCH: 29.8 pg (ref 26.0–34.0)
MCHC: 33.6 g/dL (ref 30.0–36.0)
MCV: 88.7 fL (ref 78.0–100.0)
Platelets: 169 10*3/uL (ref 150–400)
RBC: 3.26 MIL/uL — ABNORMAL LOW (ref 3.87–5.11)
RDW: 14 % (ref 11.5–15.5)
WBC: 13.6 10*3/uL — ABNORMAL HIGH (ref 4.0–10.5)

## 2016-09-07 LAB — BIRTH TISSUE RECOVERY COLLECTION (PLACENTA DONATION)

## 2016-09-07 MED ORDER — MAGNESIUM OXIDE 400 (241.3 MG) MG PO TABS
400.0000 mg | ORAL_TABLET | Freq: Every day | ORAL | Status: DC
  Administered 2016-09-07 – 2016-09-09 (×3): 400 mg via ORAL
  Filled 2016-09-07 (×4): qty 1

## 2016-09-07 MED ORDER — POLYSACCHARIDE IRON COMPLEX 150 MG PO CAPS
150.0000 mg | ORAL_CAPSULE | Freq: Every day | ORAL | Status: DC
  Administered 2016-09-07 – 2016-09-09 (×3): 150 mg via ORAL
  Filled 2016-09-07 (×3): qty 1

## 2016-09-07 NOTE — Lactation Note (Signed)
This note was copied from a baby's chart. Lactation Consultation Note  Patient Name: Jeanette [name] [id number] Date: 09/07/2016 Reason for consult: Initial assessment   Initial consult with first time mom of 60 hour old infant. Infant with 4 BF for 15-35 minutes, 3 attempts, 5 voids and 4 stools since birth. LATCH Scores 5-10 by bedside RN's. Infant weight 7 lb 8.8 oz with 4% weight loss since birth. Infant with + DAT. Infant had circumcision this morning and has been sleepy today.  Mom was attempting to latch infant on the right breast in the football hold. Assisted mom in latching infant. He was noted to have a recessed chin and lower lip was curled in, showed mom how to uncurl lip. Mom reports nipple tenderness with initial latch that improved once infant latched. Mom is noted to have compressible breasts and nipples. Nipples are everted with divoted centers. Colostrum easily expressible with hand expression. Mom practiced hand expression and had difficulty expressing colostrum. Infant fed for 15 minutes on the right breast and then unlatched himself, he then was latched to left breast in the cross cradle hold, he again needed his lower lip untucked and he fed for about 15 minutes, he was still nursing when I left the room. Mom was pleased infant fed well. Enc mom to use pillow support with feedings and massage/compress breast with feeding.   Infant is noted to have a recessed chin, high palate, and noted to pull tongue behind gumline with suckling on a gloved a finger. Infant also noted to be biting on gloved finger and at breasts, he was able to maintain a rhythmic suck at breast. Mom's nipple round when infant unlatched.  Enc mom to feed infant STS 8-12 x in 24 hours at first feeding cues. Enc mom to stimulating infant to maintain suckling with feeding. BF Resources Handout and [place] Brochure given, mom informed of IP/OP Services, BF Support Groups and Ord phone #. Enc mom to call out to desk for  feeding assistance as needed.     Maternal Data Formula Feeding for Exclusion: No Has patient been taught Hand Expression?: Yes Does the patient have breastfeeding experience prior to this delivery?: No  Feeding Feeding Type: Breast Fed Length of feed: 30 min  LATCH Score/Interventions Latch: Grasps breast easily, tongue down, lips flanged, rhythmical sucking. Intervention(s): Skin to skin;Teach feeding cues;Waking techniques Intervention(s): Breast compression;Breast massage;Assist with latch;Adjust position  Audible Swallowing: Spontaneous and intermittent Intervention(s): Alternate breast massage;Hand expression  Type of Nipple: Everted at rest and after stimulation (Divot in the center of nipples)  Comfort (Breast/Nipple): Filling, red/small blisters or bruises, mild/mod discomfort  Problem noted: Mild/Moderate discomfort Interventions  (Cracked/bleeding/bruising/blister): Expressed breast milk to nipple  Hold (Positioning): Assistance needed to correctly position infant at breast and maintain latch. Intervention(s): Breastfeeding basics reviewed;Support Pillows;Position options;Skin to skin  LATCH Score: 8  Lactation Tools Discussed/Used WIC Program: No   Consult Status Consult Status: Follow-up Date: 09/08/16 Follow-up type: In-patient    Jeanette [name] 09/07/2016, 3:59 PM

## 2016-09-07 NOTE — Progress Notes (Signed)
Patient ID: Jeanette [name], female   DOB: Apr 20, 1981, 35 y.o.   MRN: [id number] Subjective: S/P Primary Cesarean Delivery for Arrest of Dilation POD# 1 Information for the patient's newborn:  Hareem, Fatheree [id number]  female  / circ planning  Reports feeling very tired. Feeding: breast Patient reports tolerating PO.  Breast symptoms: none Pain controlled with ibuprofen (OTC) and narcotic analgesics including Percocet Denies HA/SOB/C/P/N/V/dizziness. Flatus absent. No BM. She reports vaginal bleeding as normal, without clots.  She is ambulating. Not urinating on own since d/c of Foley this morning.     Objective:   VS:  Vitals:   09/06/16 2153 09/07/16 0130 09/07/16 0540 09/07/16 0840  BP: (!) 116/52 (!) 109/58 (!) 88/54 (!) 98/56  Pulse: 93 85 83 78  Resp: 18 18 18 20   Temp: 98.5 F (36.9 C) 98.3 F (36.8 C) 98.6 F (37 C) 98.5 F (36.9 C)  TempSrc: Oral Oral Oral   SpO2: 94% 96% 98% 97%  Weight:      Height:         Intake/Output Summary (Last 24 hours) at 09/07/16 1037 Last data filed at 09/07/16 0900  Gross per 24 hour  Intake          3922.08 ml  Output             2075 ml  Net          1847.08 ml        Recent Labs  09/05/16 1700 09/07/16 0611  WBC 11.8* 13.6*  HGB 11.1* 9.7*  HCT 32.8* 28.9*  PLT 211 169     Blood type: O POS (09/27 1700)  Rubella: Immune (02/21 0000)     Physical Exam:   General: alert, cooperative, fatigued and no distress  CV: Regular rate and rhythm, S1S2 present or without murmur or extra heart sounds  Resp: clear  Abdomen: soft, nontender, normal bowel sounds  Incision: clean, dry, intact and skin well-approximated with sutures  Uterine Fundus: firm, 1 FB below umbilicus, nontender  Lochia: minimal  Ext: edema trace and Homans sign is negative, no sign of DVT   Assessment/Plan: 35 y.o.   POD# 1.  S/P Cesarean Delivery.  Indications: arrest of dilation                Principal Problem:   Postpartum care following  cesarean delivery (9/28) Active Problems:   Pregnancy   Status post primary low transverse cesarean section / FTP  Doing well, stable.               Regular diet as tolerated D/C IV per unit protocol Ambulate Routine post-op care  Graceann Congress, MSN, CNM 09/07/2016, 10:37 AM

## 2016-09-07 NOTE — Lactation Note (Signed)
This note was copied from a baby's chart. Lactation Consultation Note  Patient Name: Jeanette [name] [id number] Date: 09/07/2016   Attempted to visit with mom, infant in nursery for circumcision mom getting in shower. Will return later today.      Maternal Data    Feeding Length of feed: 0 min (too sleepy)  LATCH Score/Interventions                      Lactation Tools Discussed/Used     Consult Status      [name] [name] 09/07/2016, 12:18 PM

## 2016-09-08 ENCOUNTER — Encounter (HOSPITAL_COMMUNITY): Payer: Self-pay | Admitting: Obstetrics and Gynecology

## 2016-09-08 DIAGNOSIS — K5900 Constipation, unspecified: Secondary | ICD-10-CM

## 2016-09-08 DIAGNOSIS — O1205 Gestational edema, complicating the puerperium: Secondary | ICD-10-CM

## 2016-09-08 HISTORY — DX: Gestational edema, complicating the puerperium: O12.05

## 2016-09-08 HISTORY — DX: Constipation, unspecified: K59.00

## 2016-09-08 LAB — URINALYSIS, ROUTINE W REFLEX MICROSCOPIC
Bilirubin Urine: NEGATIVE
Glucose, UA: NEGATIVE mg/dL
Ketones, ur: NEGATIVE mg/dL
Leukocytes, UA: NEGATIVE
Nitrite: NEGATIVE
Protein, ur: NEGATIVE mg/dL
Specific Gravity, Urine: 1.01 (ref 1.005–1.030)
pH: 6 (ref 5.0–8.0)

## 2016-09-08 LAB — URINE MICROSCOPIC-ADD ON

## 2016-09-08 MED ORDER — MAGNESIUM HYDROXIDE 400 MG/5ML PO SUSP
15.0000 mL | Freq: Once | ORAL | Status: AC
  Administered 2016-09-08: 15 mL via ORAL
  Filled 2016-09-08: qty 30

## 2016-09-08 MED ORDER — HYDROCHLOROTHIAZIDE 25 MG PO TABS
25.0000 mg | ORAL_TABLET | Freq: Every day | ORAL | Status: DC
  Administered 2016-09-08 – 2016-09-09 (×2): 25 mg via ORAL
  Filled 2016-09-08 (×3): qty 1

## 2016-09-08 NOTE — Lactation Note (Signed)
This note was copied from a baby's chart. Called to Mother's room.  Mom very upset, states she doesn't want to breast feed anymore.  She states "It hurts, he bites, and lactation told me that is what he's going to do because of his recessed chin and the way he holds his tongue."  Encouragement given, dicussed options for supplementation, Mom states she wants to give formula in a bottle.  She says she may consider pumping tomorrow, but does not want to tonight.  Mom reassured, baby given 58mls of formula in a bottle per Mom's request.  Baby very uncoordinated with sucking on bottle.  Told Mom to call if she needed more help or had questions.

## 2016-09-08 NOTE — Lactation Note (Signed)
This note was copied from a baby's chart. Mom states baby breast fed for about an hour, was very happy with his latching and stated that it did not hurt this time.  States she did try to give baby more formula, but he really wanted to breast feed.  Mom given encouragement, told to call if help is needed.  Mom stated understanding.

## 2016-09-08 NOTE — Progress Notes (Signed)
Patient ID: Jeanette [name], female   DOB: 1981/06/09, 35 y.o.   MRN: [id number] Subjective: S/P Primary Cesarean Delivery for Arrest of Dilation POD# 2 Information for the patient's newborn:  Jeanette, [name] [id number]  female  / circ done  Reports feeling better than yesterday.  Feeding: breast & bottle feeding - infant has a recessed lower jaw and bites down with nursing causing intense nipple pain. Working with [place]. Patient reports tolerating PO.  Breast symptoms: nipple pain, improved with use of coconut oil. Pain controlled with ibuprofen (OTC) and narcotic analgesics including Percocet Denies HA/SOB/C/P/N/V/dizziness. Flatus present. No BM since Tuesday - requesting Milk of Magnesia. "MOM is the only thing that works for me to have a BM." She reports vaginal bleeding as normal, without clots.  She is ambulating. Not urinating on own since d/c of Foley this morning.     Objective:   VS:  Vitals:   09/07/16 0540 09/07/16 0840 09/07/16 1800 09/08/16 0544  BP: (!) 88/54 (!) 98/56 136/63 130/62  Pulse: 83 78    Resp: 18 20 18 18   Temp: 98.6 F (37 C) 98.5 F (36.9 C) 98 F (36.7 C) 98 F (36.7 C)  TempSrc: Oral  Oral   SpO2: 98% 97%    Weight:      Height:         No intake or output data in the 24 hours ending 09/08/16 1506       Recent Labs  09/05/16 1700 09/07/16 0611  WBC 11.8* 13.6*  HGB 11.1* 9.7*  HCT 32.8* 28.9*  PLT 211 169     Blood type: O POS (09/27 1700)  Rubella: Immune (02/21 0000)     Physical Exam:   General: alert, cooperative, fatigued and no distress  CV: Regular rate and rhythm, S1S2 present or without murmur or extra heart sounds  Resp: clear  Abdomen: soft, nontender, normal bowel sounds  Incision: clean, dry, intact and skin well-approximated with sutures  Uterine Fundus: firm, 2 FB below umbilicus, nontender  Lochia: minimal  Ext: edema trace and Homans sign is negative, no sign of DVT   Assessment/Plan: 35 y.o.   POD# 2.   S/P Cesarean Delivery.  Indications: arrest of dilation                Principal Problem:   Postpartum care following cesarean delivery (9/28) Active Problems:   Pregnancy   Status post primary low transverse cesarean section / FTP   Gestational edema, postpartum   Constipation  Doing well, stable.               Regular diet as tolerated Ambulate 2-3 times in hallway Offer breast to infant as much as possible, increase water intake Start HCTZ 25 mg daily x 7 days Milk of Magnesia 15 mL x 1 dose Routine post-op care Anticipate D/C home tomorrow AM  Laury Deep, M, MSN, CNM 09/08/2016, 9:07 AM

## 2016-09-09 MED ORDER — POLYSACCHARIDE IRON COMPLEX 150 MG PO CAPS: 150.0000 mg | ORAL_CAPSULE | Freq: Every day | ORAL | 1 refills | Status: DC

## 2016-09-09 MED ORDER — IBUPROFEN 600 MG PO TABS: 600.0000 mg | ORAL_TABLET | Freq: Four times a day (QID) | ORAL | 0 refills | Status: DC | PRN

## 2016-09-09 MED ORDER — SIMETHICONE 80 MG PO CHEW: 80.0000 mg | CHEWABLE_TABLET | Freq: Three times a day (TID) | ORAL | 0 refills | Status: DC

## 2016-09-09 MED ORDER — SENNOSIDES-DOCUSATE SODIUM 8.6-50 MG PO TABS: 2.0000 | ORAL_TABLET | ORAL | Status: DC

## 2016-09-09 MED ORDER — COCONUT OIL OIL: 1.0000 "application " | TOPICAL_OIL | 0 refills | Status: DC | PRN

## 2016-09-09 MED ORDER — OXYCODONE-ACETAMINOPHEN 5-325 MG PO TABS: 1.0000 | ORAL_TABLET | Freq: Four times a day (QID) | ORAL | 0 refills | Status: DC | PRN

## 2016-09-09 MED ORDER — HYDROCHLOROTHIAZIDE 25 MG PO TABS: 25.0000 mg | ORAL_TABLET | Freq: Every day | ORAL | 0 refills | Status: DC

## 2016-09-09 NOTE — Discharge Instructions (Signed)
Breast Pumping Tips °If you are breastfeeding, there may be times when you cannot feed your baby directly. Returning to work or going on a trip are common examples. Pumping allows you to store breast milk and feed it to your baby later.  °You may not get much milk when you first start to pump. Your breasts should start to make more after a few days. If you pump at the times you usually feed your baby, you may be able to keep making enough milk to feed your baby without also using formula. The more often you pump, the more milk you will produce.  °WHEN SHOULD I PUMP?  °· You can begin to pump soon after delivery. However, some experts recommend waiting about 4 weeks before giving your infant a bottle to make sure breastfeeding is going well.  °· If you plan to return to work, begin pumping a few weeks before. This will help you develop techniques that work best for you. It also lets you build up a supply of breast milk.   °· When you are with your infant, feed on demand and pump after each feeding.   °· When you are away from your infant for several hours, pump for about 15 minutes every 2-3 hours. Pump both breasts at the same time if you can.   °· If your infant has a formula feeding, make sure to pump around the same time.     °· If you drink any alcohol, wait 2 hours before pumping.   °HOW DO I PREPARE TO PUMP? °Your let-down reflex is the natural reaction to stimulation that makes your breast milk flow. It is easier to stimulate this reflex when you are relaxed. Find relaxation techniques that work for you. If you have difficulty with your let-down reflex, try these methods:  °· Smell one of your infant's blankets or an item of clothing.   °· Look at a picture or video of your infant.   °· Sit in a quiet, private space.   °· Massage the breast you plan to pump.   °· Place soothing warmth on the breast.   °· Play relaxing music.   °WHAT ARE SOME GENERAL BREAST PUMPING TIPS? °· Wash your hands before you pump. You  do not need to wash your nipples or breasts. °· There are three ways to pump. °¨ You can use your hand to massage and compress your breast. °¨ You can use a handheld manual pump. °¨ You can use an electric pump.   °· Make sure the suction cup (flange) on the breast pump is the right size. Place the flange directly over the nipple. If it is the wrong size or placed the wrong way, it may be painful and cause nipple damage.   °· If pumping is uncomfortable, apply a small amount of purified or modified lanolin to your nipple and areola. °· If you are using an electric pump, adjust the speed and suction power to be more comfortable. °· If pumping is painful or if you are not getting very much milk, you may need a different type of pump. A lactation consultant can help you determine what type of pump to use.   °· Keep a full water bottle near you at all times. Drinking lots of fluid helps you make more milk.  °· You can store your milk to use later. Pumped breast milk can be stored in a sealable, sterile container or plastic bag. Label all stored breast milk with the date you pumped it. °¨ Milk can stay out at room temperature for up to 8 hours. °¨   You can store your milk in the refrigerator for up to 8 days.  You can store your milk in the freezer for 3 months. Thaw frozen milk using warm water. Do not put it in the microwave.  Do not smoke. Smoking can lower your milk supply and harm your infant. If you need help quitting, ask your health care provider to recommend a program.  [place] A LACTATION CONSULTANT?  You are having trouble pumping.  You are concerned that you are not making enough milk.  You have nipple pain, soreness, or redness.  You want to use birth control. Birth control pills may lower your milk supply. Talk to your health care provider about your options.   This information is not intended to replace advice given to you by your health care provider.  Make sure you discuss any questions you have with your health care provider.   Document Released: 05/16/2010 Document Revised: 12/01/2013 Document Reviewed: 09/18/2013 Elsevier Interactive Patient Education [employer].

## 2016-09-09 NOTE — Discharge Summary (Signed)
OB Discharge Summary     Patient Name: Jeanette [name] DOB: 11-01-1981 MRN: [id number]  Date of admission: 09/05/2016 Delivering MD: Brien Few   Date of discharge: 09/09/2016  Admitting diagnosis: 29.4WKS CONSTANT CTX Intrauterine pregnancy: [redacted]w[redacted]d     Secondary diagnosis:  Principal Problem:   Postpartum care following cesarean delivery (9/28) Active Problems:   Pregnancy   Status post primary low transverse cesarean section / FTP   Gestational edema, postpartum   Constipation   Anemia, acute blood loss, asymptomatic.  Additional problems: dependent edema     Discharge diagnosis: Term Pregnancy Delivered and post-op day 3, stable, acute blood loss anemia                                                                                               Post partum procedures:none  Augmentation: none  Complications: None  Hospital course:  Onset of Labor With Unplanned C/S  35 y.o. yo G1P1001 at [redacted]w[redacted]d was admitted in Latent Labor on 09/05/2016. Patient had a labor course significant for suspect large for gestational age fetus and prodromal labor. Membrane Rupture Time/Date: 9:37 PM ,09/05/2016   The patient went for cesarean section due to Arrest of Dilation and Arrest of Descent, and delivered a Viable infant,09/06/2016  Details of operation can be found in separate operative note. Patient had an uncomplicated postpartum course.  She is ambulating,tolerating a regular diet, passing flatus, and urinating well.  Patient is discharged home in stable condition 09/09/16.   Physical exam Vitals:   09/07/16 1800 09/08/16 0544 09/08/16 1811 09/09/16 0616  BP: 136/63 130/62 114/71 135/75  Pulse:   88 75  Resp: 18 18 17 17   Temp: 98 F (36.7 C) 98 F (36.7 C) 98.1 F (36.7 C) 97.8 F (36.6 C)  TempSrc: Oral  Oral Oral  SpO2:      Weight:      Height:       General: alert, cooperative and no distress Lochia: appropriate Uterine Fundus: firm Incision: Dressing is clean,  dry, and intact DVT Evaluation: No evidence of DVT seen on physical exam. Calf/Ankle edema is present Labs: Lab Results  Component Value Date   WBC 13.6 (H) 09/07/2016   HGB 9.7 (L) 09/07/2016   HCT 28.9 (L) 09/07/2016   MCV 88.7 09/07/2016   PLT 169 09/07/2016   CMP Latest Ref Rng & Units 08/28/2016  Glucose 65 - 99 mg/dL 83  BUN 6 - 20 mg/dL 11  Creatinine 0.44 - 1.00 mg/dL 0.67  Sodium 135 - 145 mmol/L 131(L)  Potassium 3.5 - 5.1 mmol/L 4.0  Chloride 101 - 111 mmol/L 102  CO2 22 - 32 mmol/L 20(L)  Calcium 8.9 - 10.3 mg/dL 8.7(L)  Total Protein 6.5 - 8.1 g/dL 6.8  Total Bilirubin 0.3 - 1.2 mg/dL 0.4  Alkaline Phos 38 - 126 U/L 121  AST 15 - 41 U/L 22  ALT 14 - 54 U/L 16    Discharge instruction: per After Visit Summary and "Baby and Me Booklet".  After visit meds:    Medication List    STOP taking these medications  HYDROcodone-acetaminophen 5-325 MG tablet Commonly known as:  NORCO/VICODIN     TAKE these medications   acetaminophen 325 MG tablet Commonly known as:  TYLENOL Take 650 mg by mouth every 6 (six) hours as needed for mild pain, moderate pain or headache.   cetirizine 10 MG tablet Commonly known as:  ZYRTEC Take 10 mg by mouth daily as needed for allergies.   coconut oil Oil Apply 1 application topically as needed.   hydrochlorothiazide 25 MG tablet Commonly known as:  HYDRODIURIL Take 1 tablet (25 mg total) by mouth daily. Start taking on:  09/10/2016   ibuprofen 600 MG tablet Commonly known as:  ADVIL,MOTRIN Take 1 tablet (600 mg total) by mouth every 6 (six) hours as needed for mild pain.   iron polysaccharides 150 MG capsule Commonly known as:  NIFEREX Take 1 capsule (150 mg total) by mouth daily. Start taking on:  09/10/2016   lansoprazole 30 MG capsule Commonly known as:  PREVACID Take 30 mg by mouth daily.   oxyCODONE-acetaminophen 5-325 MG tablet Commonly known as:  PERCOCET/ROXICET Take 1-2 tablets by mouth every 6 (six) hours  as needed (pain scale 4-7).   prenatal multivitamin Tabs tablet Take 1 tablet by mouth daily.   senna-docusate 8.6-50 MG tablet Commonly known as:  Senokot-S Take 2 tablets by mouth daily. Start taking on:  09/10/2016   simethicone 80 MG chewable tablet Commonly known as:  MYLICON Chew 1 tablet (80 mg total) by mouth 3 (three) times daily after meals.       Diet: routine diet  Activity: Advance as tolerated. Pelvic rest for 6 weeks.   Outpatient follow up:6 weeks Follow up Appt:No future appointments. Follow up Visit:No Follow-up on file.  Postpartum contraception: Not Discussed  Newborn Data: Live born female , [name], circ complete inpatient Birth Weight: 7 lb 14.5 oz (3585 g) APGAR: 8, 9  Baby Feeding: Breast Disposition:home with mother   09/09/2016 Juliene Pina, CNM

## 2016-09-09 NOTE — Progress Notes (Signed)
Subjective: POD# 3 Information for the patient's newborn:  Jeanette, [name] [id number]  female   circ completed Baby name: Jeanette [name]  Reports feeling well, ready for discharge Feeding: breast Patient reports tolerating PO.  Breast symptoms: sore nipples Pain controlled withPO meds Denies HA/SOB/C/P/N/V/dizziness. Flatus present, no BM yet. She reports vaginal bleeding as normal, without clots.  She is ambulating, urinating without difficulty.     Objective:   VS:    Vitals:   09/07/16 1800 09/08/16 0544 09/08/16 1811 09/09/16 0616  BP: 136/63 130/62 114/71 135/75  Pulse:   88 75  Resp: 18 18 17 17   Temp: 98 F (36.7 C) 98 F (36.7 C) 98.1 F (36.7 C) 97.8 F (36.6 C)  TempSrc: Oral  Oral Oral  SpO2:      Weight:      Height:        No intake or output data in the 24 hours ending 09/09/16 1015      Recent Labs  09/07/16 0611  WBC 13.6*  HGB 9.7*  HCT 28.9*  PLT 169     Blood type: --/--/O POS, O POS (09/27 1700)  Rubella: Immune (02/21 0000)     Physical Exam:  General: alert, cooperative and no distress CV: Regular rate and rhythm Resp: clear Abdomen: soft, nontender, normal bowel sounds Incision: clean, dry and intact Uterine Fundus: firm, below umbilicus, nontender Lochia: minimal Ext: edema +2 pedal      Assessment/Plan: 35 y.o.   POD# 3. [id number]                  Principal Problem:   Postpartum care following cesarean delivery (9/28) Active Problems:   Pregnancy   Status post primary low transverse cesarean section / FTP   Gestational edema, postpartum   Constipation   Doing well, stable.               Advance diet as tolerated, MOM and fiber daily, push PO fluids Encourage rest when baby rests Breastfeeding support Encourage to ambulate Routine post-op care DC gome today  Juliene Pina, CNM, MSN 09/09/2016, 10:15 AM

## 2016-09-09 NOTE — Lactation Note (Signed)
This note was copied from a baby's chart. Lactation Consultation Note: Mom just finished feeding baby a bottle of formula. Reports her breasts are feeling fuller this morning. Reports she pumped before this feeding but only obtained a few mls on one breast. Encouraged breast massage and hand expression with pumping. Reports it feels like baby is biting when he latches on. Has not put baby to breast in 24 hours.States she still wants to try to breast feed.  Encouraged to pump q 2-3 hours to prevent engorgement. Asking about using NS- I offered to help her at next feeding. Baby just had 45 ml of formula so is asleep now. Mom has Medela pump for home. Has appointment with [facility] at [facility] tomorrow. Comfort gels given to mom with instructions for use. No questions at present. To call prn  Patient Name: Jeanette [name] [id number] Date: 09/09/2016 Reason for consult: Follow-up assessment   Maternal Data Formula Feeding for Exclusion: No Has patient been taught Hand Expression?: Yes Does the patient have breastfeeding experience prior to this delivery?: No  Feeding Feeding Type: Formula Nipple Type: Slow - flow  LATCH Score/Interventions       Type of Nipple: Everted at rest and after stimulation     Problem noted: Mild/Moderate discomfort Interventions  (Cracked/bleeding/bruising/blister): Double electric pump;Expressed breast milk to nipple        Lactation Tools Discussed/Used WIC Program: No Pump Review: Setup, frequency, and cleaning;Milk Storage Initiated by:: [place] Date initiated:: 09/09/16   Consult Status Consult Status: Complete    Truddie Crumble 09/09/2016, 10:21 AM

## 2016-09-10 ENCOUNTER — Encounter (HOSPITAL_COMMUNITY): Payer: Self-pay

## 2017-03-30 IMAGING — US US MFM OB DETAIL+14 WK
1 series · 13 of 28 positions shown · non-contrast
Comparison: none

[Series 1: us mfm ob detail+14 wk · 34 acquisitions, 13 frames shown]
[im 2/34]
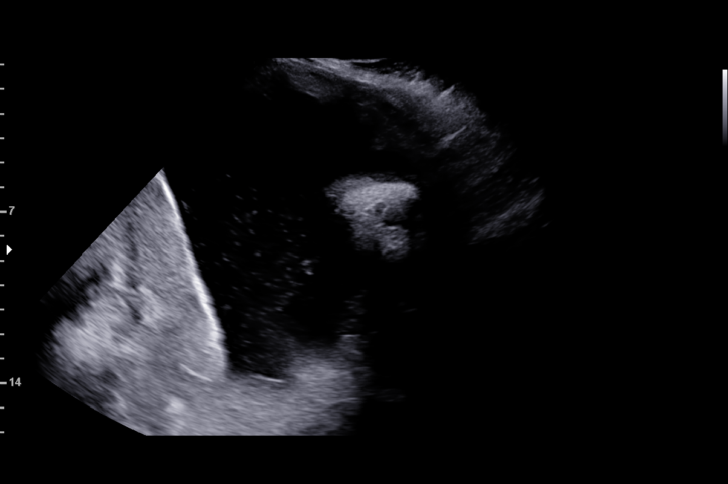
[im 4/34]
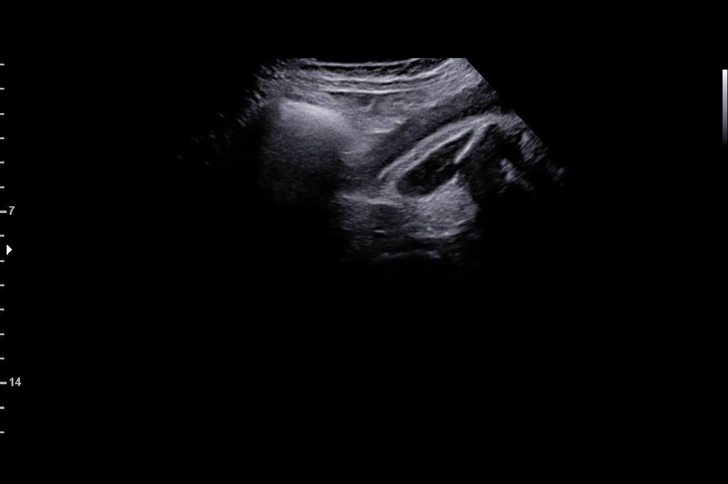
[im 7/34]
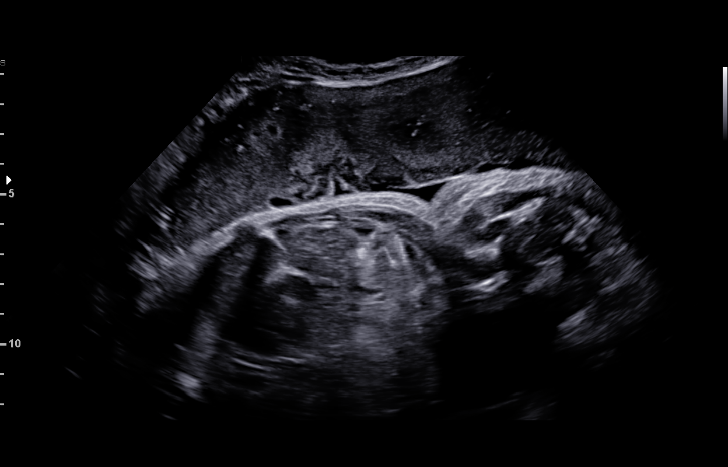
[im 9/34]
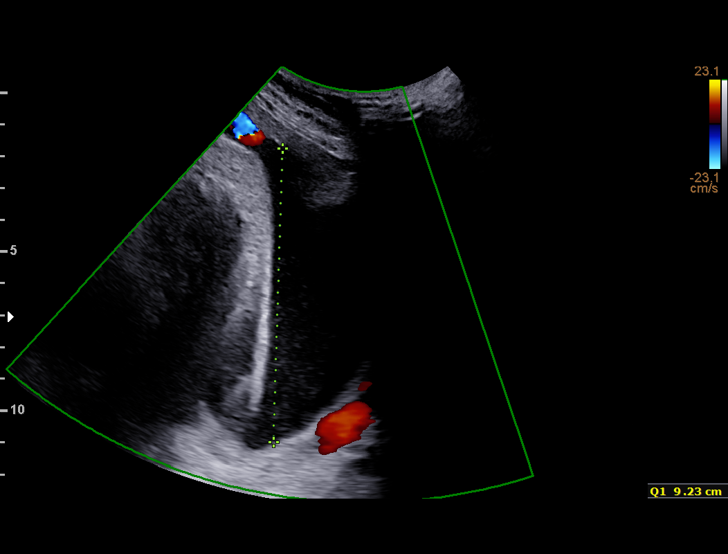
[im 12/34]
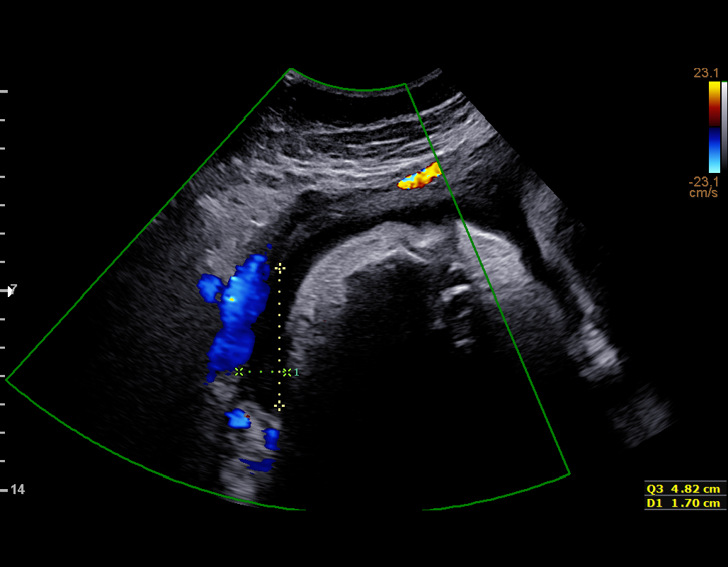
[im 14/34]
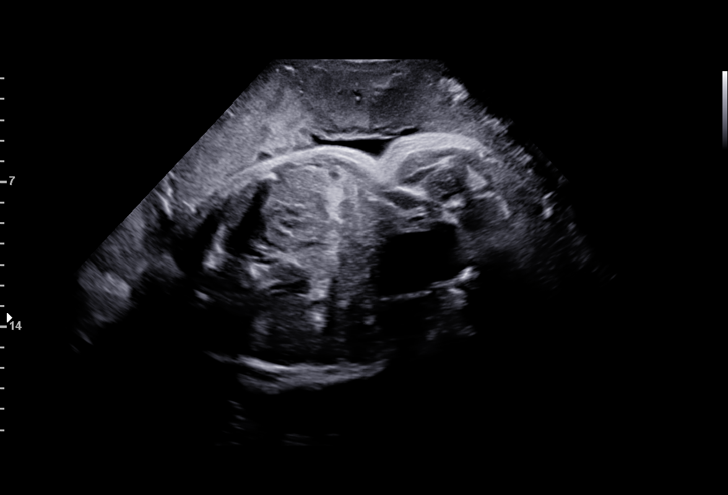
[im 18/34]
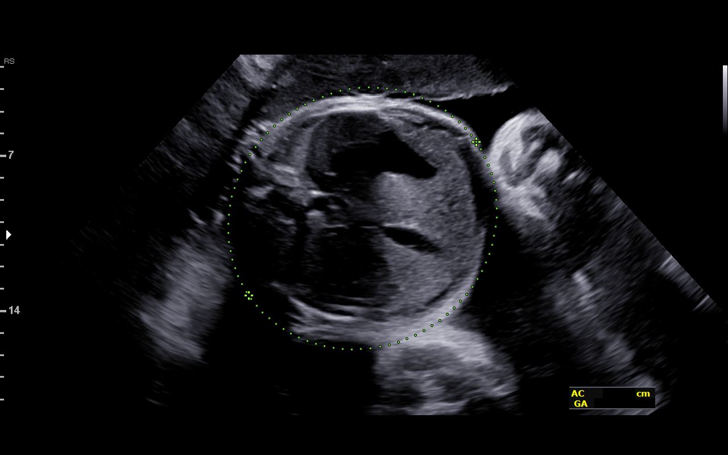
[im 20/34]
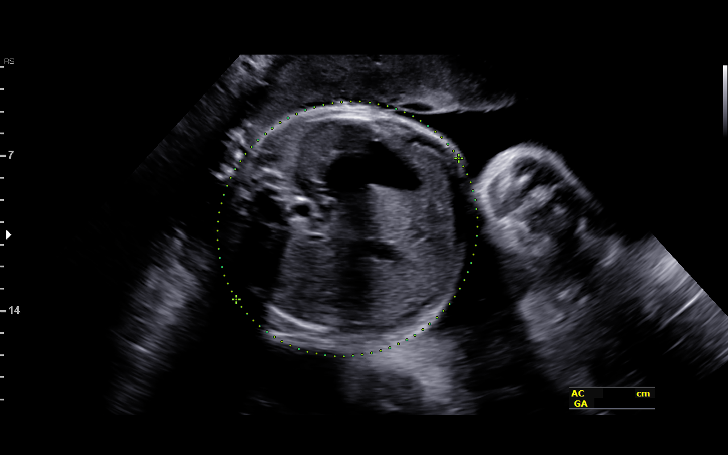
[im 23/34]
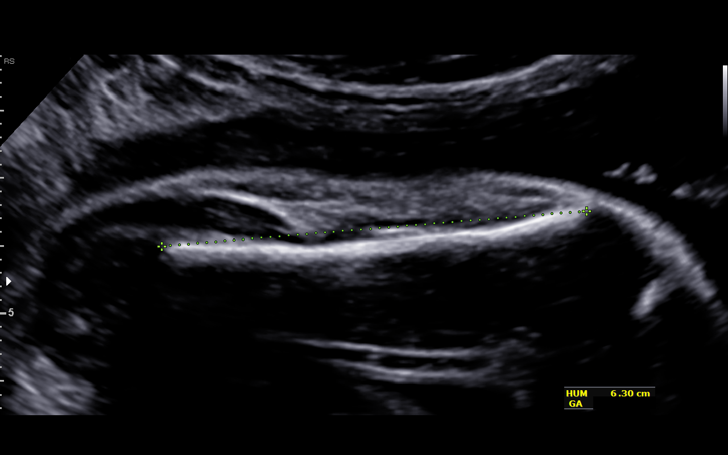
[im 25/34]
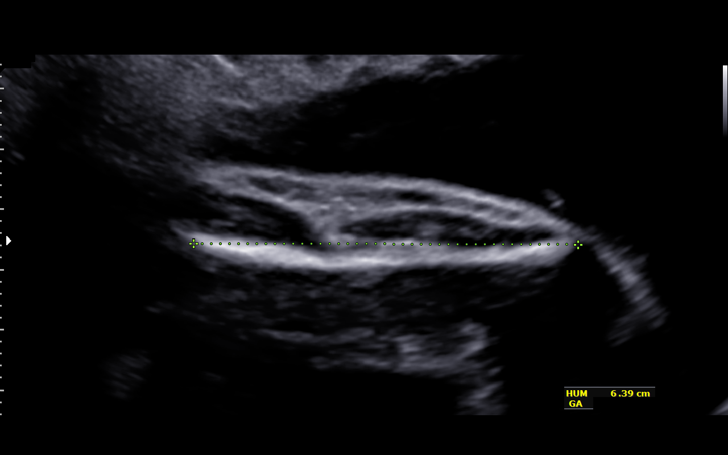
[im 27/34]
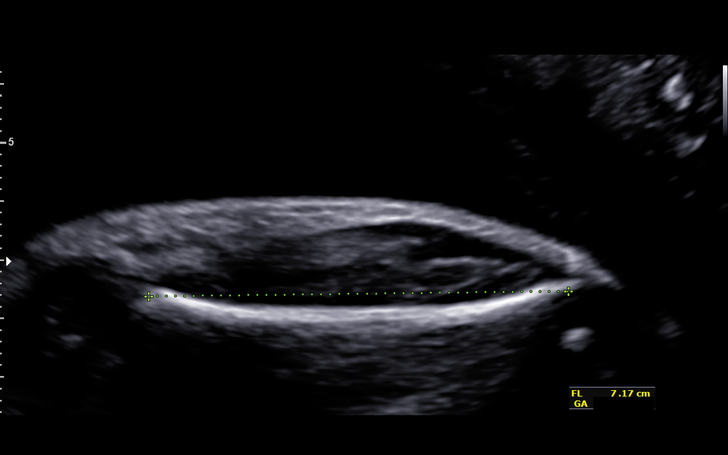
[im 30/34]
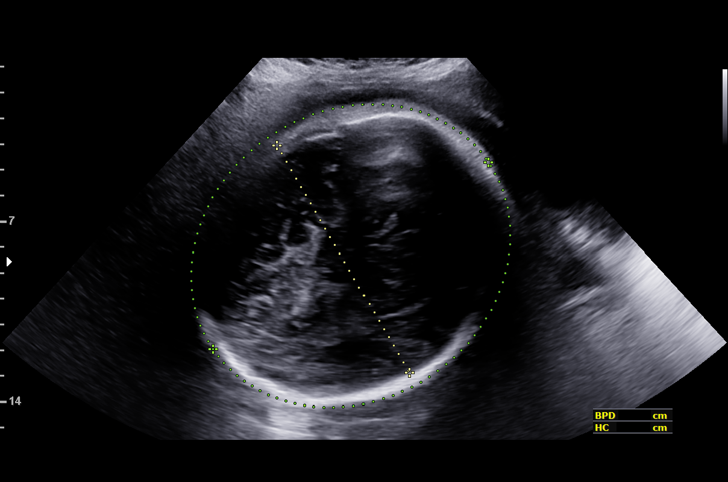
[im 32/34]
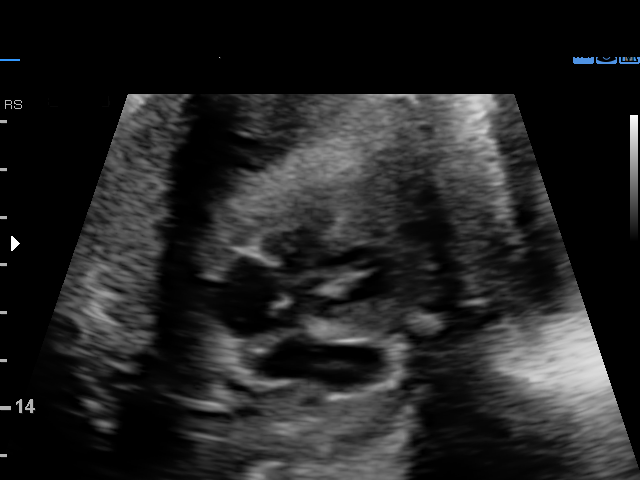

[13 of 28 positions shown; findings below may reference images not displayed]

1  RANTONA BHEBHE            [id number]        [id number]     [id number]
Indications

39 weeks gestation of pregnancy
Detailed fetal anatomic survey                 Z36
Uterine size-date discrepancy, third trimester
Advanced maternal age primigravida 35+,
third trimester
Fetal Evaluation

Num Of Fetuses:     1
Fetal Heart         157
Rate(bpm):
Cardiac Activity:   Observed
Presentation:       Cephalic
Placenta:           Fundal, above cervical os
P. Cord Insertion:  Not well visualized

Amniotic Fluid
AFI FV:      Subjectively within normal limits

AFI Sum(cm)     %Tile       Largest Pocket(cm)
23.33           96

RUQ(cm)       RLQ(cm)       LUQ(cm)        LLQ(cm)
9.23
Biometry

BPD:     100.1  mm     G. Age:  41w 1d         98  %    CI:        74.22   %   70 - 86
FL/HC:      19.5   %   20.7 -
HC:      368.9  mm     G. Age:  N/A          > 97  %    HC/AC:      1.00       0.87 -
AC:      367.1  mm     G. Age:  40w 4d         92  %    FL/BPD:     72.0   %   71 - 87
FL:       72.1  mm     G. Age:  37w 0d          7  %    FL/AC:      19.6   %   20 - 24
HUM:      63.3  mm     G. Age:  36w 5d         24  %
Est. FW:    4651  gm    8 lb 15 oz    > 90  %
Gestational Age

U/S Today:     39w 4d                                        EDD:   09/08/16
Best:          39w 4d    Det. By:   Early Ultrasound         EDD:   09/08/16
Anatomy

Cranium:               Appears normal         Aortic Arch:            Not well visualized
Cavum:                 Not well visualized    Ductal Arch:            Not well visualized
Ventricles:            Not well visualized    Diaphragm:              Not well visualized
Choroid Plexus:        Not well visualized    Stomach:                Appears normal, left
sided
Cerebellum:            Not well visualized    Abdomen:                Appears normal
Posterior Fossa:       Not well visualized    Abdominal Wall:         Not well visualized
Nuchal Fold:           Not applicable (>20    Cord Vessels:           Not well visualized
wks GA)
Face:                  Not well visualized    Kidneys:                Lt appears normal,
Rt not well vis
Lips:                  Appears normal         Bladder:                Appears normal
Thoracic:              Appears normal         Spine:                  Not well visualized
Heart:                 Not well visualized    Upper Extremities:      Not well visualized
RVOT:                  Not well visualized    Lower Extremities:      Not well visualized
LVOT:                  Not well visualized

Other:  Technically difficult due to advanced gestational age. Technically
difficult due to maternal habitus and fetal position.
Cervix Uterus Adnexa

Cervix
Not visualized (advanced GA >80wks)

Uterus
No abnormality visualized.

Left Ovary
Not visualized.

Right Ovary
Not visualized.

Adnexa:       No abnormality visualized. No adnexal mass
visualized.
Impression

IUP at 39+4 weeks
Size > Dates on office evaluation
Cephalic, fundal placenta. AFI 23cm
Anatomy evaluation suboptimal due to late gestational age
EFW 4651 grams
Recommendations

Discussed this case at length with Dr. San. She does not
meet ACOG or SMFM criteria for a primary C/S delivery for
fetal weight. However, the patient has an undescended vertex
at her due date and an unfavorable cervix. she is having
contractions of variable intensity about every 6-8 minutes.
I feel it is reasonable to await cervical ripening or active labor,
but not beyond 41 weeks. I suggested that she undergo NST
and cervical exam on 09/10/2016 and 09/12/2016. If bishops
score becomes 6 or greater woud proceed with induction, but
would be very aware that the clinical situation is very
suggestive of dystocia, and would proceed with delivery by
C/S for any deviation from a normal labor curve. No further
MFM US are indicated at this time

## 2019-04-15 LAB — OB RESULTS CONSOLE ABO/RH: RH Type: POSITIVE

## 2019-04-15 LAB — OB RESULTS CONSOLE HEPATITIS B SURFACE ANTIGEN: Hepatitis B Surface Ag: NEGATIVE

## 2019-04-15 LAB — OB RESULTS CONSOLE GC/CHLAMYDIA
Chlamydia: NEGATIVE
Gonorrhea: NEGATIVE

## 2019-04-15 LAB — OB RESULTS CONSOLE RUBELLA ANTIBODY, IGM: Rubella: IMMUNE

## 2019-04-15 LAB — OB RESULTS CONSOLE ANTIBODY SCREEN: Antibody Screen: NEGATIVE

## 2019-04-15 LAB — OB RESULTS CONSOLE RPR: RPR: NONREACTIVE

## 2019-04-15 LAB — OB RESULTS CONSOLE HIV ANTIBODY (ROUTINE TESTING): HIV: NONREACTIVE

## 2019-10-07 LAB — OB RESULTS CONSOLE GBS: GBS: NEGATIVE

## 2019-10-08 ENCOUNTER — Other Ambulatory Visit: Payer: Self-pay | Admitting: Obstetrics & Gynecology

## 2019-10-22 ENCOUNTER — Encounter (HOSPITAL_COMMUNITY): Payer: Self-pay

## 2019-10-22 ENCOUNTER — Encounter (HOSPITAL_COMMUNITY): Payer: Self-pay | Admitting: *Deleted

## 2019-10-22 NOTE — Patient Instructions (Signed)
ILER LO  10/22/2019   Your procedure is scheduled on:  10/26/2019  Arrive at 31 at Entrance C on [address] at [facility]  and [address]. You are invited to use the FREE valet parking or use the Visitor's parking deck.  Pick up the phone at the desk and dial [phone].  Call this number if you have problems the morning of surgery: [phone]  Remember:   Do not eat food:(After Midnight) Desps de medianoche.  Do not drink clear liquids: (After Midnight) Desps de medianoche.  Take these medicines the morning of surgery with A SIP OF WATER:  May take prilosec   Do not wear jewelry, make-up or nail polish.  Do not wear lotions, powders, or perfumes. Do not wear deodorant.  Do not shave 48 hours prior to surgery.  Do not bring valuables to the hospital.  [facility] is not   responsible for any belongings or valuables brought to the hospital.  Contacts, dentures or bridgework may not be worn into surgery.  Leave suitcase in the car. After surgery it may be brought to your room.  For patients admitted to the hospital, checkout time is 11:00 AM the day of              discharge.      Please read over the following fact sheets that you were given:     Preparing for Surgery

## 2019-10-23 NOTE — Patient Instructions (Signed)
LUVERTA MCQUEEN  10/23/2019   Your procedure is scheduled on:  10/26/2019  Arrive at 51 at Entrance C on [address] at [facility]  and [address]. You are invited to use the FREE valet parking or use the Visitor's parking deck.  Pick up the phone at the desk and dial [phone].  Call this number if you have problems the morning of surgery: [phone]  Remember:   Do not eat food:(After Midnight) Desps de medianoche.  Do not drink clear liquids: (After Midnight) Desps de medianoche.  Take these medicines the morning of surgery with A SIP OF WATER:  none   Do not wear jewelry, make-up or nail polish.  Do not wear lotions, powders, or perfumes. Do not wear deodorant.  Do not shave 48 hours prior to surgery.  Do not bring valuables to the hospital.  [facility] is not   responsible for any belongings or valuables brought to the hospital.  Contacts, dentures or bridgework may not be worn into surgery.  Leave suitcase in the car. After surgery it may be brought to your room.  For patients admitted to the hospital, checkout time is 11:00 AM the day of              discharge.      Please read over the following fact sheets that you were given:     Preparing for Surgery

## 2019-10-24 ENCOUNTER — Other Ambulatory Visit: Payer: Self-pay

## 2019-10-24 ENCOUNTER — Encounter (HOSPITAL_COMMUNITY): Payer: Self-pay

## 2019-10-24 ENCOUNTER — Other Ambulatory Visit (HOSPITAL_COMMUNITY)
Admission: RE | Admit: 2019-10-24 | Discharge: 2019-10-24 | Disposition: A | Discharge status: Home / Self Care | Payer: BC Managed Care – PPO | Source: Ambulatory Visit | Attending: Obstetrics & Gynecology | Admitting: Obstetrics & Gynecology

## 2019-10-24 ENCOUNTER — Inpatient Hospital Stay (HOSPITAL_COMMUNITY)
Admission: AD | Admit: 2019-10-24 | Discharge: 2019-10-25 | Disposition: A | Discharge status: Home / Self Care | Payer: BC Managed Care – PPO | Source: Ambulatory Visit | Attending: Obstetrics and Gynecology | Admitting: Obstetrics and Gynecology

## 2019-10-24 DIAGNOSIS — O479 False labor, unspecified: Secondary | ICD-10-CM

## 2019-10-24 DIAGNOSIS — Z20828 Contact with and (suspected) exposure to other viral communicable diseases: Secondary | ICD-10-CM | POA: Insufficient documentation

## 2019-10-24 DIAGNOSIS — O34219 Maternal care for unspecified type scar from previous cesarean delivery: Secondary | ICD-10-CM | POA: Insufficient documentation

## 2019-10-24 DIAGNOSIS — Z3A39 39 weeks gestation of pregnancy: Secondary | ICD-10-CM | POA: Insufficient documentation

## 2019-10-24 DIAGNOSIS — Z01812 Encounter for preprocedural laboratory examination: Secondary | ICD-10-CM | POA: Insufficient documentation

## 2019-10-24 LAB — CBC
HCT: 32.7 % — ABNORMAL LOW (ref 36.0–46.0)
Hemoglobin: 10.7 g/dL — ABNORMAL LOW (ref 12.0–15.0)
MCH: 27.7 pg (ref 26.0–34.0)
MCHC: 32.7 g/dL (ref 30.0–36.0)
MCV: 84.7 fL (ref 80.0–100.0)
Platelets: 237 10*3/uL (ref 150–400)
RBC: 3.86 MIL/uL — ABNORMAL LOW (ref 3.87–5.11)
RDW: 13.1 % (ref 11.5–15.5)
WBC: 9.7 10*3/uL (ref 4.0–10.5)
nRBC: 0 % (ref 0.0–0.2)

## 2019-10-24 LAB — SARS CORONAVIRUS 2 (TAT 6-24 HRS): SARS Coronavirus 2: NEGATIVE

## 2019-10-24 LAB — RPR: RPR Ser Ql: NONREACTIVE

## 2019-10-24 MED ORDER — PROMETHAZINE HCL 25 MG/ML IJ SOLN
12.5000 mg | Freq: Once | INTRAMUSCULAR | Status: AC
  Administered 2019-10-24: 12.5 mg via INTRAMUSCULAR
  Filled 2019-10-24: qty 1

## 2019-10-24 MED ORDER — BUTORPHANOL TARTRATE 1 MG/ML IJ SOLN
1.0000 mg | Freq: Once | INTRAMUSCULAR | Status: AC
  Administered 2019-10-24: 1 mg via INTRAMUSCULAR
  Filled 2019-10-24: qty 1

## 2019-10-24 NOTE — MAU Note (Signed)
I have communicated with Dr. Ronita Hipps and reviewed vital signs:  Vitals:   10/24/19 2213 10/24/19 2341  BP: 133/86 135/85  Pulse: 94 84  Resp:  20  Temp:    SpO2:      Vaginal exam:  Dilation: 1 Effacement (%): 60 Cervical Position: Posterior Station: Ballotable Presentation: Vertex Exam by:: Maryagnes Amos, RN,    Also reviewed contraction pattern and that non-stress test is reactive.  It has been documented that patient is contracting every 1-4 minutes with no cervical change over 1 hours not indicating active labor.  Patient denies any other complaints.  Based on this report provider has given verbal order for MAU provider to put in discharge orders.  Labor discharge instructions reviewed with patient.

## 2019-10-24 NOTE — MAU Note (Signed)
Pt here for contractions that started around 4pm that are now every 4-5 mins. She denies LOF or vaginal bleeding. Reports good fetal movement. Cervix FT on last exam. Scheduled for rLTCS on Monday.

## 2019-10-24 NOTE — MAU Note (Signed)
RN contacted Dr. Ronita Hipps.  Verbal orders for 1mg  stadol IM and 12.5 mg phenergan IM. Patient to go home after medication administered. Per Dr. Ronita Hipps, "RN doesn't need to call back." Per Dr. Ronita Hipps, MAU provider to put in discharge orders.

## 2019-10-24 NOTE — MAU Note (Signed)
Pt here for PAT covid swab and lab draw. Denies symptoms. Swab collected. Pt verbalizes understanding to not remove blood bank bracelet.  

## 2019-10-25 NOTE — Discharge Instructions (Signed)
Cesarean Delivery °Cesarean birth, or cesarean delivery, is the surgical delivery of a baby through an incision in the abdomen and the uterus. This may be referred to as a C-section. This procedure may be scheduled ahead of time, or it may be done in an emergency situation. °Tell a health care provider about: °· Any allergies you have. °· All medicines you are taking, including vitamins, herbs, eye drops, creams, and over-the-counter medicines. °· Any problems you or family members have had with anesthetic medicines. °· Any blood disorders you have. °· Any surgeries you have had. °· Any medical conditions you have. °· Whether you or any members of your family have a history of deep vein thrombosis (DVT) or pulmonary embolism (PE). °What are the risks? °Generally, this is a safe procedure. However, problems may occur, including: °· Infection. °· Bleeding. °· Allergic reactions to medicines. °· Damage to other structures or organs. °· Blood clots. °· Injury to your baby. °What happens before the procedure? °General instructions °· Follow instructions from your health care provider about eating or drinking restrictions. °· If you know that you are going to have a cesarean delivery, do not shave your pubic area. Shaving before the procedure may increase your risk of infection. °· Plan to have someone take you home from the hospital. °· Ask your health care provider what steps will be taken to prevent infection. These may include: °? Removing hair at the surgery site. °? Washing skin with a germ-killing soap. °? Taking antibiotic medicine. °· Depending on the reason for your cesarean delivery, you may have a physical exam or additional testing, such as an ultrasound. °· You may have your blood or urine tested. °Questions for your health care provider °· Ask your health care provider about: °? Changing or stopping your regular medicines. This is especially important if you are taking diabetes medicines or blood  thinners. °? Your pain management plan. This is especially important if you plan to breastfeed your baby. °? How long you will be in the hospital after the procedure. °? Any concerns you may have about receiving blood products, if you need them during the procedure. °? Cord blood banking, if you plan to collect your baby's umbilical cord blood. °· You may also want to ask your health care provider: °? Whether you will be able to hold or breastfeed your baby while you are still in the operating room. °? Whether your baby can stay with you immediately after the procedure and during your recovery. °? Whether a family member or a person of your choice can go with you into the operating room and stay with you during the procedure, immediately after the procedure, and during your recovery. °What happens during the procedure? ° °· An IV will be inserted into one of your veins. °· Fluid and medicines, such as antibiotics, will be given before the surgery. °· Fetal monitors will be placed on your abdomen to check your baby's heart rate. °· You may be given a special warming gown to wear to keep your temperature stable. °· A catheter may be inserted into your bladder through your urethra. This drains your urine during the procedure. °· You may be given one or more of the following: °? A medicine to numb the area (local anesthetic). °? A medicine to make you fall asleep (general anesthetic). °? A medicine (regional anesthetic) that is injected into your back or through a small thin tube placed in your back (spinal anesthetic or epidural anesthetic).   This numbs everything below the injection site and allows you to stay awake during your procedure. If this makes you feel nauseous, tell your health care provider. Medicines will be available to help reduce any nausea you may feel.  An incision will be made in your abdomen, and then in your uterus.  If you are awake during your procedure, you may feel tugging and pulling in  your abdomen, but you should not feel pain. If you feel pain, tell your health care provider immediately.  Your baby will be removed from your uterus. You may feel more pressure or pushing while this happens.  Immediately after birth, your baby will be dried and kept warm. You may be able to hold and breastfeed your baby.  The umbilical cord may be clamped and cut during this time. This usually occurs after waiting a period of 1-2 minutes after delivery.  Your placenta will be removed from your uterus.  Your incisions will be closed with stitches (sutures). Staples, skin glue, or adhesive strips may also be applied to the incision in your abdomen.  Bandages (dressings) may be placed over the incision in your abdomen. The procedure may vary among health care providers and hospitals. What happens after the procedure?  Your blood pressure, heart rate, breathing rate, and blood oxygen level will be monitored until you are discharged from the hospital.  You may continue to receive fluids and medicines through an IV.  You will have some pain. Medicines will be available to help control your pain.  To help prevent blood clots: ? You may be given medicines. ? You may have to wear compression stockings or devices. ? You will be encouraged to walk around when you are able.  Hospital staff will encourage and support bonding with your baby. Your hospital may have you and your baby to stay in the same room (rooming in) during your hospital stay to encourage successful bonding and breastfeeding.  You may be encouraged to cough and breathe deeply often. This helps to prevent lung problems.  If you have a catheter draining your urine, it will be removed as soon as possible after your procedure. Summary  Cesarean birth, or cesarean delivery, is the surgical delivery of a baby through an incision in the abdomen and the uterus.  Follow instructions from your health care provider about eating or  drinking restrictions before the procedure.  You will have some pain after the procedure. Medicines will be available to help control your pain.  Hospital staff will encourage and support bonding with your baby after the procedure. Your hospital may have you and your baby to stay in the same room (rooming in) during your hospital stay to encourage successful bonding and breastfeeding. This information is not intended to replace advice given to you by your health care provider. Make sure you discuss any questions you have with your health care provider. Document Released: 11/26/2005 Document Revised: 06/02/2018 Document Reviewed: 06/02/2018 Elsevier Patient Education  2020 [employer]. Signs and Symptoms of Labor Labor is your body's natural process of moving your baby, placenta, and umbilical cord out of your uterus. The process of labor usually starts when your baby is full-term, between 36 and 40 weeks of pregnancy. How will I know when I am close to going into labor? As your body prepares for labor and the birth of your baby, you may notice the following symptoms in the weeks and days before true labor starts:  Having a strong desire to get  your home ready to receive your new baby. This is called nesting. Nesting may be a sign that labor is approaching, and it may occur several weeks before birth. Nesting may involve cleaning and organizing your home.  Passing a small amount of thick, bloody mucus out of your vagina (normal bloody show or losing your mucus plug). This may happen more than a week before labor begins, or it might occur right before labor begins as the opening of the cervix starts to widen (dilate). For some women, the entire mucus plug passes at once. For others, smaller portions of the mucus plug may gradually pass over several days.  Your baby moving (dropping) lower in your pelvis to get into position for birth (lightening). When this happens, you may feel more pressure on  your bladder and pelvic bone and less pressure on your ribs. This may make it easier to breathe. It may also cause you to need to urinate more often and have problems with bowel movements.  Having "practice contractions" (Braxton Hicks contractions) that occur at irregular (unevenly spaced) intervals that are more than 10 minutes apart. This is also called false labor. False labor contractions are common after exercise or sexual activity, and they will stop if you change position, rest, or drink fluids. These contractions are usually mild and do not get stronger over time. They may feel like: ? A backache or back pain. ? Mild cramps, similar to menstrual cramps. ? Tightening or pressure in your abdomen. Other early symptoms that labor may be starting soon include:  Nausea or loss of appetite.  Diarrhea.  Having a sudden burst of energy, or feeling very tired.  Mood changes.  Having trouble sleeping. How will I know when labor has begun? Signs that true labor has begun may include:  Having contractions that come at regular (evenly spaced) intervals and increase in intensity. This may feel like more intense tightening or pressure in your abdomen that moves to your back. ? Contractions may also feel like rhythmic pain in your upper thighs or back that comes and goes at regular intervals. ? For first-time mothers, this change in intensity of contractions often occurs at a more gradual pace. ? Women who have given birth before may notice a more rapid progression of contraction changes.  Having a feeling of pressure in the vaginal area.  Your water breaking (rupture of membranes). This is when the sac of fluid that surrounds your baby breaks. When this happens, you will notice fluid leaking from your vagina. This may be clear or blood-tinged. Labor usually starts within 24 hours of your water breaking, but it may take longer to begin. ? Some women notice this as a gush of fluid. ? Others  notice that their underwear repeatedly becomes damp. Follow these instructions at home:   When labor starts, or if your water breaks, call your health care provider or nurse care line. Based on your situation, they will determine when you should go in for an exam.  When you are in early labor, you may be able to rest and manage symptoms at home. Some strategies to try at home include: ? Breathing and relaxation techniques. ? Taking a warm bath or shower. ? Listening to music. ? Using a heating pad on the lower back for pain. If you are directed to use heat:  Place a towel between your skin and the heat source.  Leave the heat on for 20-30 minutes.  Remove the heat if your skin  turns bright red. This is especially important if you are unable to feel pain, heat, or cold. You may have a greater risk of getting burned. Get help right away if:  You have painful, regular contractions that are 5 minutes apart or less.  Labor starts before you are [redacted] weeks along in your pregnancy.  You have a fever.  You have a headache that does not go away.  You have bright red blood coming from your vagina.  You do not feel your baby moving.  You have a sudden onset of: ? Severe headache with vision problems. ? Nausea, vomiting, or diarrhea. ? Chest pain or shortness of breath. These symptoms may be an emergency. If your health care provider recommends that you go to the hospital or birth center where you plan to deliver, do not drive yourself. Have someone else drive you, or call emergency services (911 in the [place] Summary  Labor is your body's natural process of moving your baby, placenta, and umbilical cord out of your uterus.  The process of labor usually starts when your baby is full-term, between 4 and 40 weeks of pregnancy.  When labor starts, or if your water breaks, call your health care provider or nurse care line. Based on your situation, they will determine when you should go in for  an exam. This information is not intended to replace advice given to you by your health care provider. Make sure you discuss any questions you have with your health care provider. Document Released: 05/03/2017 Document Revised: 08/26/2017 Document Reviewed: 05/03/2017 Elsevier Patient Education  2020 [employer]. Cesarean Delivery Cesarean birth, or cesarean delivery, is the surgical delivery of a baby through an incision in the abdomen and the uterus. This may be referred to as a C-section. This procedure may be scheduled ahead of time, or it may be done in an emergency situation. Tell a health care provider about:  Any allergies you have.  All medicines you are taking, including vitamins, herbs, eye drops, creams, and over-the-counter medicines.  Any problems you or family members have had with anesthetic medicines.  Any blood disorders you have.  Any surgeries you have had.  Any medical conditions you have.  Whether you or any members of your family have a history of deep vein thrombosis (DVT) or pulmonary embolism (PE). What are the risks? Generally, this is a safe procedure. However, problems may occur, including:  Infection.  Bleeding.  Allergic reactions to medicines.  Damage to other structures or organs.  Blood clots.  Injury to your baby. What happens before the procedure? General instructions  Follow instructions from your health care provider about eating or drinking restrictions.  If you know that you are going to have a cesarean delivery, do not shave your pubic area. Shaving before the procedure may increase your risk of infection.  Plan to have someone take you home from the hospital.  Ask your health care provider what steps will be taken to prevent infection. These may include: ? Removing hair at the surgery site. ? Washing skin with a germ-killing soap. ? Taking antibiotic medicine.  Depending on the reason for your cesarean delivery, you may have  a physical exam or additional testing, such as an ultrasound.  You may have your blood or urine tested. Questions for your health care provider  Ask your health care provider about: ? Changing or stopping your regular medicines. This is especially important if you are taking diabetes medicines or blood thinners. ?  Your pain management plan. This is especially important if you plan to breastfeed your baby. ? How long you will be in the hospital after the procedure. ? Any concerns you may have about receiving blood products, if you need them during the procedure. ? Cord blood banking, if you plan to collect your baby's umbilical cord blood.  You may also want to ask your health care provider: ? Whether you will be able to hold or breastfeed your baby while you are still in the operating room. ? Whether your baby can stay with you immediately after the procedure and during your recovery. ? Whether a family member or a person of your choice can go with you into the operating room and stay with you during the procedure, immediately after the procedure, and during your recovery. What happens during the procedure?   An IV will be inserted into one of your veins.  Fluid and medicines, such as antibiotics, will be given before the surgery.  Fetal monitors will be placed on your abdomen to check your baby's heart rate.  You may be given a special warming gown to wear to keep your temperature stable.  A catheter may be inserted into your bladder through your urethra. This drains your urine during the procedure.  You may be given one or more of the following: ? A medicine to numb the area (local anesthetic). ? A medicine to make you fall asleep (general anesthetic). ? A medicine (regional anesthetic) that is injected into your back or through a small thin tube placed in your back (spinal anesthetic or epidural anesthetic). This numbs everything below the injection site and allows you to stay  awake during your procedure. If this makes you feel nauseous, tell your health care provider. Medicines will be available to help reduce any nausea you may feel.  An incision will be made in your abdomen, and then in your uterus.  If you are awake during your procedure, you may feel tugging and pulling in your abdomen, but you should not feel pain. If you feel pain, tell your health care provider immediately.  Your baby will be removed from your uterus. You may feel more pressure or pushing while this happens.  Immediately after birth, your baby will be dried and kept warm. You may be able to hold and breastfeed your baby.  The umbilical cord may be clamped and cut during this time. This usually occurs after waiting a period of 1-2 minutes after delivery.  Your placenta will be removed from your uterus.  Your incisions will be closed with stitches (sutures). Staples, skin glue, or adhesive strips may also be applied to the incision in your abdomen.  Bandages (dressings) may be placed over the incision in your abdomen. The procedure may vary among health care providers and hospitals. What happens after the procedure?  Your blood pressure, heart rate, breathing rate, and blood oxygen level will be monitored until you are discharged from the hospital.  You may continue to receive fluids and medicines through an IV.  You will have some pain. Medicines will be available to help control your pain.  To help prevent blood clots: ? You may be given medicines. ? You may have to wear compression stockings or devices. ? You will be encouraged to walk around when you are able.  Hospital staff will encourage and support bonding with your baby. Your hospital may have you and your baby to stay in the same room (rooming in) during your  hospital stay to encourage successful bonding and breastfeeding.  You may be encouraged to cough and breathe deeply often. This helps to prevent lung problems.  If  you have a catheter draining your urine, it will be removed as soon as possible after your procedure. Summary  Cesarean birth, or cesarean delivery, is the surgical delivery of a baby through an incision in the abdomen and the uterus.  Follow instructions from your health care provider about eating or drinking restrictions before the procedure.  You will have some pain after the procedure. Medicines will be available to help control your pain.  Hospital staff will encourage and support bonding with your baby after the procedure. Your hospital may have you and your baby to stay in the same room (rooming in) during your hospital stay to encourage successful bonding and breastfeeding. This information is not intended to replace advice given to you by your health care provider. Make sure you discuss any questions you have with your health care provider. Document Released: 11/26/2005 Document Revised: 06/02/2018 Document Reviewed: 06/02/2018 Elsevier Patient Education  2020 [employer].

## 2019-10-25 NOTE — Progress Notes (Signed)
Called by RN for labor evaluation.  Pt with irregular contractions and no cervical change. Category 1 tracing.  Scheduled for rpt csection at 39 wks.   Offered IM analgesia for prodromal labor symptoms. Instructed RN to follow up triage with [facility] provider or me as needed for admission or DC.

## 2019-10-26 ENCOUNTER — Inpatient Hospital Stay (HOSPITAL_COMMUNITY): Payer: BC Managed Care – PPO | Admitting: Anesthesiology

## 2019-10-26 ENCOUNTER — Encounter (HOSPITAL_COMMUNITY): Payer: Self-pay | Admitting: *Deleted

## 2019-10-26 ENCOUNTER — Inpatient Hospital Stay (HOSPITAL_COMMUNITY)
Admission: RE | Admit: 2019-10-26 | Discharge: 2019-10-29 | DRG: 784 | Disposition: A | Discharge status: Home / Self Care | Payer: BC Managed Care – PPO | Attending: Obstetrics & Gynecology | Admitting: Obstetrics & Gynecology

## 2019-10-26 ENCOUNTER — Other Ambulatory Visit: Payer: Self-pay

## 2019-10-26 ENCOUNTER — Encounter (HOSPITAL_COMMUNITY)
Admission: RE | Disposition: A | Discharge status: Home / Self Care | Payer: Self-pay | Source: Home / Self Care | Attending: Obstetrics & Gynecology

## 2019-10-26 DIAGNOSIS — Z302 Encounter for sterilization: Secondary | ICD-10-CM | POA: Diagnosis not present

## 2019-10-26 DIAGNOSIS — Z98891 History of uterine scar from previous surgery: Secondary | ICD-10-CM

## 2019-10-26 DIAGNOSIS — O34211 Maternal care for low transverse scar from previous cesarean delivery: Principal | ICD-10-CM | POA: Diagnosis present

## 2019-10-26 DIAGNOSIS — Z87891 Personal history of nicotine dependence: Secondary | ICD-10-CM | POA: Diagnosis not present

## 2019-10-26 DIAGNOSIS — O9081 Anemia of the puerperium: Secondary | ICD-10-CM | POA: Diagnosis not present

## 2019-10-26 DIAGNOSIS — D62 Acute posthemorrhagic anemia: Secondary | ICD-10-CM | POA: Diagnosis not present

## 2019-10-26 DIAGNOSIS — O99214 Obesity complicating childbirth: Secondary | ICD-10-CM | POA: Diagnosis present

## 2019-10-26 DIAGNOSIS — Z3A39 39 weeks gestation of pregnancy: Secondary | ICD-10-CM

## 2019-10-26 LAB — TYPE AND SCREEN
ABO/RH(D): O POS
Antibody Screen: NEGATIVE

## 2019-10-26 LAB — CREATININE, SERUM
Creatinine, Ser: 0.73 mg/dL (ref 0.44–1.00)
GFR calc Af Amer: >60 mL/min (ref >60)
GFR calc non Af Amer: >60 mL/min (ref >60)

## 2019-10-26 LAB — CBC
HCT: 31 % — ABNORMAL LOW (ref 36.0–46.0)
Hemoglobin: 9.7 g/dL — ABNORMAL LOW (ref 12.0–15.0)
MCH: 27.2 pg (ref 26.0–34.0)
MCHC: 31.3 g/dL (ref 30.0–36.0)
MCV: 87.1 fL (ref 80.0–100.0)
Platelets: 185 10*3/uL (ref 150–400)
RBC: 3.56 MIL/uL — ABNORMAL LOW (ref 3.87–5.11)
RDW: 13 % (ref 11.5–15.5)
WBC: 11.4 10*3/uL — ABNORMAL HIGH (ref 4.0–10.5)
nRBC: 0 % (ref 0.0–0.2)

## 2019-10-26 LAB — ABO/RH: ABO/RH(D): O POS

## 2019-10-26 SURGERY — Surgical Case
Anesthesia: Spinal | Site: Abdomen | Laterality: Bilateral | Wound class: Clean Contaminated

## 2019-10-26 MED ORDER — MORPHINE SULFATE (PF) 0.5 MG/ML IJ SOLN
INTRAMUSCULAR | Status: AC
  Filled 2019-10-26: qty 10

## 2019-10-26 MED ORDER — SENNOSIDES-DOCUSATE SODIUM 8.6-50 MG PO TABS
2.0000 | ORAL_TABLET | ORAL | Status: DC
  Administered 2019-10-26 – 2019-10-28 (×3): 2 via ORAL
  Filled 2019-10-26 (×3): qty 2

## 2019-10-26 MED ORDER — BUPIVACAINE IN DEXTROSE 0.75-8.25 % IT SOLN
INTRATHECAL | Status: DC | PRN
  Administered 2019-10-26: 1.6 mL via INTRATHECAL

## 2019-10-26 MED ORDER — OXYCODONE-ACETAMINOPHEN 5-325 MG PO TABS
1.0000 | ORAL_TABLET | ORAL | Status: DC | PRN
  Administered 2019-10-27 – 2019-10-28 (×2): 1 via ORAL
  Filled 2019-10-26 (×2): qty 1

## 2019-10-26 MED ORDER — COCONUT OIL OIL: 1.0000 "application " | TOPICAL_OIL | Status: DC | PRN

## 2019-10-26 MED ORDER — PRENATAL MULTIVITAMIN CH
1.0000 | ORAL_TABLET | Freq: Every day | ORAL | Status: DC
  Administered 2019-10-27 – 2019-10-28 (×2): 1 via ORAL
  Filled 2019-10-26 (×2): qty 1

## 2019-10-26 MED ORDER — KETOROLAC TROMETHAMINE 30 MG/ML IJ SOLN
30.0000 mg | Freq: Four times a day (QID) | INTRAMUSCULAR | Status: AC
  Administered 2019-10-26 – 2019-10-27 (×3): 30 mg via INTRAVENOUS
  Filled 2019-10-26 (×3): qty 1

## 2019-10-26 MED ORDER — NALBUPHINE HCL 10 MG/ML IJ SOLN
5.0000 mg | Freq: Once | INTRAMUSCULAR | Status: DC | PRN
  Filled 2019-10-26: qty 0.5

## 2019-10-26 MED ORDER — ZOLPIDEM TARTRATE 5 MG PO TABS: 5.0000 mg | ORAL_TABLET | Freq: Every evening | ORAL | Status: DC | PRN

## 2019-10-26 MED ORDER — ONDANSETRON HCL 4 MG/2ML IJ SOLN
INTRAMUSCULAR | Status: DC | PRN
  Administered 2019-10-26: 4 mg via INTRAVENOUS

## 2019-10-26 MED ORDER — FERROUS SULFATE 325 (65 FE) MG PO TABS
325.0000 mg | ORAL_TABLET | Freq: Every day | ORAL | Status: DC
  Administered 2019-10-27: 325 mg via ORAL
  Filled 2019-10-26: qty 1

## 2019-10-26 MED ORDER — NALBUPHINE HCL 10 MG/ML IJ SOLN
5.0000 mg | INTRAMUSCULAR | Status: DC | PRN
  Filled 2019-10-26: qty 0.5

## 2019-10-26 MED ORDER — DIPHENHYDRAMINE HCL 50 MG/ML IJ SOLN
INTRAMUSCULAR | Status: AC
  Filled 2019-10-26: qty 1

## 2019-10-26 MED ORDER — IBUPROFEN 800 MG PO TABS
800.0000 mg | ORAL_TABLET | Freq: Four times a day (QID) | ORAL | Status: DC
  Administered 2019-10-27 – 2019-10-29 (×7): 800 mg via ORAL
  Filled 2019-10-26 (×7): qty 1

## 2019-10-26 MED ORDER — FENTANYL CITRATE (PF) 100 MCG/2ML IJ SOLN: 25.0000 ug | INTRAMUSCULAR | Status: DC | PRN

## 2019-10-26 MED ORDER — KETOROLAC TROMETHAMINE 30 MG/ML IJ SOLN
30.0000 mg | Freq: Four times a day (QID) | INTRAMUSCULAR | Status: AC | PRN
  Administered 2019-10-26: 14:00:00 30 mg via INTRAMUSCULAR

## 2019-10-26 MED ORDER — ACETAMINOPHEN 10 MG/ML IV SOLN
1000.0000 mg | Freq: Once | INTRAVENOUS | Status: DC | PRN
  Administered 2019-10-26: 1000 mg via INTRAVENOUS

## 2019-10-26 MED ORDER — ONDANSETRON HCL 4 MG/2ML IJ SOLN
INTRAMUSCULAR | Status: AC
  Filled 2019-10-26: qty 2

## 2019-10-26 MED ORDER — LIDOCAINE-EPINEPHRINE (PF) 2 %-1:200000 IJ SOLN
INTRAMUSCULAR | Status: AC
  Filled 2019-10-26: qty 10

## 2019-10-26 MED ORDER — TETANUS-DIPHTH-ACELL PERTUSSIS 5-2.5-18.5 LF-MCG/0.5 IM SUSP: 0.5000 mL | Freq: Once | INTRAMUSCULAR | Status: DC

## 2019-10-26 MED ORDER — CEFAZOLIN SODIUM-DEXTROSE 2-4 GM/100ML-% IV SOLN
INTRAVENOUS | Status: AC
  Filled 2019-10-26: qty 100

## 2019-10-26 MED ORDER — SCOPOLAMINE 1 MG/3DAYS TD PT72
1.0000 | MEDICATED_PATCH | Freq: Once | TRANSDERMAL | Status: AC
  Administered 2019-10-26: 11:00:00 1.5 mg via TRANSDERMAL

## 2019-10-26 MED ORDER — DIPHENHYDRAMINE HCL 25 MG PO CAPS
25.0000 mg | ORAL_CAPSULE | ORAL | Status: DC | PRN
  Administered 2019-10-26: 25 mg via ORAL
  Filled 2019-10-26: qty 1

## 2019-10-26 MED ORDER — ACETAMINOPHEN 10 MG/ML IV SOLN
INTRAVENOUS | Status: AC
  Filled 2019-10-26: qty 100

## 2019-10-26 MED ORDER — ONDANSETRON HCL 4 MG/2ML IJ SOLN: 4.0000 mg | Freq: Three times a day (TID) | INTRAMUSCULAR | Status: DC | PRN

## 2019-10-26 MED ORDER — SCOPOLAMINE 1 MG/3DAYS TD PT72
MEDICATED_PATCH | TRANSDERMAL | Status: AC
  Filled 2019-10-26: qty 1

## 2019-10-26 MED ORDER — LACTATED RINGERS IV SOLN
INTRAVENOUS | Status: DC
  Administered 2019-10-26: 125 mL/h via INTRAVENOUS
  Administered 2019-10-26: via INTRAVENOUS

## 2019-10-26 MED ORDER — NALOXONE HCL 0.4 MG/ML IJ SOLN: 0.4000 mg | INTRAMUSCULAR | Status: DC | PRN

## 2019-10-26 MED ORDER — KETOROLAC TROMETHAMINE 30 MG/ML IJ SOLN: 30.0000 mg | Freq: Once | INTRAMUSCULAR | Status: DC | PRN

## 2019-10-26 MED ORDER — FENTANYL CITRATE (PF) 100 MCG/2ML IJ SOLN
INTRAMUSCULAR | Status: AC
  Filled 2019-10-26: qty 2

## 2019-10-26 MED ORDER — MORPHINE SULFATE (PF) 0.5 MG/ML IJ SOLN
INTRAMUSCULAR | Status: DC | PRN
  Administered 2019-10-26: .15 mg via INTRATHECAL

## 2019-10-26 MED ORDER — SODIUM CHLORIDE 0.9 % IR SOLN
Status: DC | PRN
  Administered 2019-10-26: 1

## 2019-10-26 MED ORDER — SODIUM CHLORIDE 0.9% FLUSH: 3.0000 mL | INTRAVENOUS | Status: DC | PRN

## 2019-10-26 MED ORDER — SIMETHICONE 80 MG PO CHEW
80.0000 mg | CHEWABLE_TABLET | Freq: Three times a day (TID) | ORAL | Status: DC
  Administered 2019-10-27 – 2019-10-29 (×7): 80 mg via ORAL
  Filled 2019-10-26 (×7): qty 1

## 2019-10-26 MED ORDER — CEFAZOLIN SODIUM-DEXTROSE 2-4 GM/100ML-% IV SOLN
2.0000 g | INTRAVENOUS | Status: AC
  Administered 2019-10-26: 2 g via INTRAVENOUS

## 2019-10-26 MED ORDER — MENTHOL 3 MG MT LOZG: 1.0000 | LOZENGE | OROMUCOSAL | Status: DC | PRN

## 2019-10-26 MED ORDER — ENOXAPARIN SODIUM 60 MG/0.6ML ~~LOC~~ SOLN
50.0000 mg | SUBCUTANEOUS | Status: DC
  Administered 2019-10-27 – 2019-10-29 (×3): 50 mg via SUBCUTANEOUS
  Filled 2019-10-26 (×3): qty 0.6

## 2019-10-26 MED ORDER — STERILE WATER FOR IRRIGATION IR SOLN
Status: DC | PRN
  Administered 2019-10-26: 1

## 2019-10-26 MED ORDER — SIMETHICONE 80 MG PO CHEW
80.0000 mg | CHEWABLE_TABLET | ORAL | Status: DC | PRN
  Filled 2019-10-26: qty 1

## 2019-10-26 MED ORDER — FENTANYL CITRATE (PF) 100 MCG/2ML IJ SOLN
INTRAMUSCULAR | Status: DC | PRN
  Administered 2019-10-26: 15 ug via INTRATHECAL

## 2019-10-26 MED ORDER — SODIUM CHLORIDE 0.9 % IV SOLN
INTRAVENOUS | Status: DC | PRN
  Administered 2019-10-26: 13:00:00 via INTRAVENOUS

## 2019-10-26 MED ORDER — KETOROLAC TROMETHAMINE 30 MG/ML IJ SOLN
INTRAMUSCULAR | Status: AC
  Filled 2019-10-26: qty 1

## 2019-10-26 MED ORDER — DIPHENHYDRAMINE HCL 25 MG PO CAPS: 25.0000 mg | ORAL_CAPSULE | Freq: Four times a day (QID) | ORAL | Status: DC | PRN

## 2019-10-26 MED ORDER — OXYTOCIN 40 UNITS IN NORMAL SALINE INFUSION - SIMPLE MED: 2.5000 [IU]/h | INTRAVENOUS | Status: AC

## 2019-10-26 MED ORDER — ACETAMINOPHEN 500 MG PO TABS
1000.0000 mg | ORAL_TABLET | Freq: Four times a day (QID) | ORAL | Status: AC
  Administered 2019-10-26 – 2019-10-27 (×2): 1000 mg via ORAL
  Filled 2019-10-26 (×3): qty 2

## 2019-10-26 MED ORDER — NALOXONE HCL 4 MG/10ML IJ SOLN
1.0000 ug/kg/h | INTRAVENOUS | Status: DC | PRN
  Filled 2019-10-26: qty 5

## 2019-10-26 MED ORDER — LACTATED RINGERS IV SOLN
INTRAVENOUS | Status: DC
  Administered 2019-10-26 (×4): via INTRAVENOUS

## 2019-10-26 MED ORDER — OXYTOCIN 40 UNITS IN NORMAL SALINE INFUSION - SIMPLE MED
INTRAVENOUS | Status: AC
  Filled 2019-10-26: qty 1000

## 2019-10-26 MED ORDER — DIPHENHYDRAMINE HCL 50 MG/ML IJ SOLN
12.5000 mg | INTRAMUSCULAR | Status: DC | PRN
  Administered 2019-10-26: 15:00:00 12.5 mg via INTRAVENOUS

## 2019-10-26 MED ORDER — SIMETHICONE 80 MG PO CHEW
80.0000 mg | CHEWABLE_TABLET | ORAL | Status: DC
  Administered 2019-10-26 – 2019-10-28 (×3): 80 mg via ORAL
  Filled 2019-10-26 (×3): qty 1

## 2019-10-26 MED ORDER — OXYTOCIN 40 UNITS IN NORMAL SALINE INFUSION - SIMPLE MED
INTRAVENOUS | Status: DC | PRN
  Administered 2019-10-26: 40 [IU] via INTRAVENOUS

## 2019-10-26 MED ORDER — PHENYLEPHRINE HCL-NACL 20-0.9 MG/250ML-% IV SOLN
INTRAVENOUS | Status: DC | PRN
  Administered 2019-10-26: 60 ug/min via INTRAVENOUS

## 2019-10-26 MED ORDER — KETOROLAC TROMETHAMINE 30 MG/ML IJ SOLN: 30.0000 mg | Freq: Four times a day (QID) | INTRAMUSCULAR | Status: AC | PRN

## 2019-10-26 MED ORDER — PHENYLEPHRINE HCL-NACL 20-0.9 MG/250ML-% IV SOLN
INTRAVENOUS | Status: AC
  Filled 2019-10-26: qty 250

## 2019-10-26 SURGICAL SUPPLY — 35 items
APL SKNCLS STERI-STRIP NONHPOA (GAUZE/BANDAGES/DRESSINGS) ×1
BENZOIN TINCTURE PRP APPL 2/3 (GAUZE/BANDAGES/DRESSINGS) ×1 IMPLANT
CHLORAPREP W/TINT 26ML (MISCELLANEOUS) ×2 IMPLANT
CLAMP CORD UMBIL (MISCELLANEOUS) IMPLANT
CLOTH BEACON ORANGE TIMEOUT ST (SAFETY) ×2 IMPLANT
DRSG OPSITE POSTOP 4X10 (GAUZE/BANDAGES/DRESSINGS) ×2 IMPLANT
ELECT REM PT RETURN 9FT ADLT (ELECTROSURGICAL) ×2
ELECTRODE REM PT RTRN 9FT ADLT (ELECTROSURGICAL) ×1 IMPLANT
EXTRACTOR VACUUM KIWI (MISCELLANEOUS) IMPLANT
EXTRACTOR VACUUM M CUP 4 TUBE (SUCTIONS) IMPLANT
GLOVE BIO SURGEON STRL SZ7 (GLOVE) ×2 IMPLANT
GLOVE BIOGEL PI IND STRL 7.0 (GLOVE) ×3 IMPLANT
GLOVE BIOGEL PI INDICATOR 7.0 (GLOVE) ×3
GOWN STRL REUS W/TWL LRG LVL3 (GOWN DISPOSABLE) ×6 IMPLANT
KIT ABG SYR 3ML LUER SLIP (SYRINGE) IMPLANT
NDL HYPO 25X5/8 SAFETYGLIDE (NEEDLE) IMPLANT
NEEDLE HYPO 25X5/8 SAFETYGLIDE (NEEDLE) IMPLANT
NS IRRIG 1000ML POUR BTL (IV SOLUTION) ×2 IMPLANT
PACK C SECTION WH (CUSTOM PROCEDURE TRAY) ×2 IMPLANT
PAD OB MATERNITY 4.3X12.25 (PERSONAL CARE ITEMS) ×2 IMPLANT
RTRCTR C-SECT PINK 25CM LRG (MISCELLANEOUS) IMPLANT
STRIP CLOSURE SKIN 1/2X4 (GAUZE/BANDAGES/DRESSINGS) ×1 IMPLANT
SUT MNCRL 0 VIOLET CTX 36 (SUTURE) ×2 IMPLANT
SUT MONOCRYL 0 CTX 36 (SUTURE) ×2
SUT PLAIN 0 NONE (SUTURE) IMPLANT
SUT PLAIN 2 0 (SUTURE)
SUT PLAIN ABS 2-0 CT1 27XMFL (SUTURE) IMPLANT
SUT VIC AB 0 CT1 27 (SUTURE) ×4
SUT VIC AB 0 CT1 27XBRD ANBCTR (SUTURE) ×2 IMPLANT
SUT VIC AB 2-0 CT1 27 (SUTURE) ×2
SUT VIC AB 2-0 CT1 TAPERPNT 27 (SUTURE) ×1 IMPLANT
SUT VIC AB 4-0 KS 27 (SUTURE) ×2 IMPLANT
TOWEL OR 17X24 6PK STRL BLUE (TOWEL DISPOSABLE) ×2 IMPLANT
TRAY FOLEY W/BAG SLVR 14FR LF (SET/KITS/TRAYS/PACK) IMPLANT
WATER STERILE IRR 1000ML POUR (IV SOLUTION) ×2 IMPLANT

## 2019-10-26 NOTE — H&P (Addendum)
Jeanette [name] is a 38 y.o. female presenting for repeat C/section and permanent sterilization with tubal ligation due to h/o prior LTCS for arrest of dilatation and descent. Good FMs, no VB/ LOF/ UCs G2P1001, married to a new parter/ FOB, AMA, maternal obesity, 50# wt gain. Nl Panorama, nl Glucola, GBS(-). No other medical problems.  Pregnancy overall uncomplicated, [facility]- [facility] from 7 wks. Growth at 30, 36 wks, last at 36 wks, EFW 85% and AC 90%, AFI 20cm, Vx. NST wkly for AMA from 36 wks- reactive.  Took Flu and TDAP vaccines.  OB History    Gravida  2   Para  1   Term  1   Preterm      AB      Living  1     SAB      TAB      Ectopic      Multiple  0   Live Births  1          Past Medical History:  Diagnosis Date  . Constipation 09/08/2016  . Depression   . GERD (gastroesophageal reflux disease)   . Gestational edema, postpartum 09/08/2016  . History of cellulitis   . Seasonal allergies    Past Surgical History:  Procedure Laterality Date  . CESAREAN SECTION N/A 09/06/2016   Procedure: CESAREAN SECTION;  Surgeon: Brien Few, MD;  Location: [facility];  Service: Obstetrics;  Laterality: N/A;  . NO PAST SURGERIES    . WISDOM TOOTH EXTRACTION     Family History: family history includes Cancer in her maternal grandfather, maternal uncle, and paternal aunt; Heart disease in her father and mother; Hypertension in her father and mother. Social History:  reports that she quit smoking about 18 years ago. She has never used smokeless tobacco. She reports current alcohol use. She reports that she does not use drugs.    Maternal Diabetes: No Genetic Screening: Normal Maternal Ultrasounds/Referrals: Normal Fetal Ultrasounds or other Referrals:  None Maternal Substance Abuse:  No Significant Maternal Medications:  None Significant Maternal Lab Results:  Group B Strep negative Other Comments:  None  ROS fatigue History Exam Physical Exam  :   A&O x 3, no acute distress. Pleasant HEENT neg, no thyromegaly Lungs CTA bilat CV RRR, S1S2 normal Abdo soft, non tender, non acute. Gravid large uterus S>D Extr no edema/ tenderness Pelvic Cx closed, long  FHT present, wnl. Toco none   Prenatal labs: ABO, Rh: O/Positive/-- (05/06 0000) Antibody: Negative (05/06 0000) Rubella: Immune (05/06 0000) RPR: NON REACTIVE (11/14 1004)  HBsAg: Negative (05/06 0000)  HIV: Non-reactive (05/06 0000)  GBS: Negative/-- (10/28 0000)  Panorama nl, XX AFP1 neg Glucola 120  Assessment/Plan: 38 yo G2P1001, at 39 wks, here for repeat c-section and bilateral tubal ligation with possible salpingectomy. Salpingectomy risk vs benefit reviewed.  Risks/complications of surgery reviewed incl infection, bleeding, damage to internal organs including bladder, bowels, ureters, blood vessels, other risks from anesthesia, VTE and delayed complications of any surgery, complications in future surgery reviewed. Also discussed neonatal complications incl difficult delivery, laceration, vacuum assistance, TTN etc. Pt understands and agrees, all concerns addressed.     Elveria Royals 10/26/2019

## 2019-10-26 NOTE — Transfer of Care (Signed)
Immediate Anesthesia Transfer of Care Note  Patient: Jeanette [name]  Procedure(s) Performed: Repeat CESAREAN SECTION WITH BILATERAL TUBAL LIGATION by Salpingectomy (Bilateral Abdomen)  Patient Location: PACU  Anesthesia Type:Spinal  Level of Consciousness: awake  Airway & Oxygen Therapy: Patient Spontanous Breathing  Post-op Assessment: Report given to RN and Post -op Vital signs reviewed and stable  Post vital signs: Reviewed and stable  Last Vitals:  Vitals Value Taken Time  BP 133/66 10/26/19 1415  Temp 36.6 C 10/26/19 1400  Pulse 84 10/26/19 1418  Resp 16 10/26/19 1418  SpO2 99 % 10/26/19 1418  Vitals shown include unvalidated device data.  Last Pain:  Vitals:   10/26/19 1415  TempSrc:   PainSc: 0-No pain         Complications: No apparent anesthesia complications

## 2019-10-26 NOTE — Anesthesia Procedure Notes (Signed)
Spinal  Patient location during procedure: OR Start time: 10/26/2019 12:37 PM End time: 10/26/2019 12:47 PM Staffing Anesthesiologist: Freddrick March, MD Performed: anesthesiologist  Preanesthetic Checklist Completed: patient identified, surgical consent, pre-op evaluation, timeout performed, IV checked, risks and benefits discussed and monitors and equipment checked Spinal Block Patient position: sitting Prep: site prepped and draped and DuraPrep Patient monitoring: cardiac monitor, continuous pulse ox and blood pressure Approach: midline Location: L3-4 Injection technique: single-shot Needle Needle type: Pencan  Needle gauge: 24 G Needle length: 9 cm Assessment Sensory level: T6 Additional Notes Functioning IV was confirmed and monitors were applied. Sterile prep and drape, including hand hygiene and sterile gloves were used. The patient was positioned and the spine was prepped. The skin was anesthetized with lidocaine.  Free flow of clear CSF was obtained prior to injecting local anesthetic into the CSF.  The spinal needle aspirated freely following injection.  The needle was carefully withdrawn.  The patient tolerated the procedure well.

## 2019-10-26 NOTE — Anesthesia Preprocedure Evaluation (Signed)
Anesthesia Evaluation  Patient identified by MRN, date of birth, ID band Patient awake    Reviewed: Allergy & Precautions, NPO status , Patient's Chart, lab work & pertinent test results  Airway Mallampati: II  TM Distance: >3 FB Neck ROM: Full    Dental no notable dental hx. (+) Teeth Intact, Dental Advisory Given   Pulmonary neg pulmonary ROS, former smoker,    Pulmonary exam normal breath sounds clear to auscultation       Cardiovascular negative cardio ROS Normal cardiovascular exam Rhythm:Regular Rate:Normal     Neuro/Psych PSYCHIATRIC DISORDERS Depression negative neurological ROS     GI/Hepatic negative GI ROS, Neg liver ROS,   Endo/Other  Morbid obesity  Renal/GU negative Renal ROS  negative genitourinary   Musculoskeletal negative musculoskeletal ROS (+)   Abdominal   Peds  Hematology  (+) Blood dyscrasia (Hgb 10.7), anemia ,   Anesthesia Other Findings Repeat C/Sx1  Reproductive/Obstetrics (+) Pregnancy                            Anesthesia Physical Anesthesia Plan  ASA: III  Anesthesia Plan: Spinal   Post-op Pain Management:    Induction:   PONV Risk Score and Plan: Treatment may vary due to age or medical condition  Airway Management Planned: Natural Airway  Additional Equipment:   Intra-op Plan:   Post-operative Plan:   Informed Consent: I have reviewed the patients History and Physical, chart, labs and discussed the procedure including the risks, benefits and alternatives for the proposed anesthesia with the patient or authorized representative who has indicated his/her understanding and acceptance.     Dental advisory given  Plan Discussed with: CRNA  Anesthesia Plan Comments:         Anesthesia Quick Evaluation

## 2019-10-26 NOTE — Op Note (Signed)
Cesarean Section Procedure Note   Jeanette [name]  10/26/2019  Procedure: Repeat Low transverse C-section and sterilization with Bilateral salpingectomy.   Indications: Scheduled Proceedure/Maternal Request and requesting permanent sterilization    Pre-operative Diagnosis: Previous Cesarean Section, Desires Sterilization.   Post-operative Diagnosis: Same   Surgeon: Azucena Fallen, MD  Assistants: Artelia Laroche, CNM  Anesthesia: spinal   Procedure Details:  The patient was seen in the Holding Room. The risks, benefits, complications, treatment options, and expected outcomes were discussed with the patient. The patient concurred with the proposed plan, giving informed consent. identified as Jeanette [name] and the procedure verified as C-Section Delivery. A Time Out was held and the above information confirmed.  After induction of anesthesia, the patient was draped and prepped in the usual sterile manner, foley was draining urine well.  A pfannenstiel incision was made and carried down through the subcutaneous tissue to the fascia. Fascial incision was made and extended transversely. The fascia was separated from the underlying rectus tissue superiorly and inferiorly. The peritoneum was identified and entered. Peritoneal incision was extended longitudinally. Alexis-O retractor placed. The utero-vesical peritoneal reflection was incised transversely and the bladder flap was bluntly freed from the lower uterine segment. A low transverse uterine incision was made. Copious clear amniotic fluid drained.. Delivered from cephalic presentation was a female infant with vigorous cry. Apgar scores of 6 at one minute and 9 at five minutes. Delayed cord clamping done at 1 minute and baby handed to NICU team in attendance. Cord ph was not sent. Cord blood was obtained for evaluation. The placenta was removed Intact and appeared normal. The uterine outline, tubes and ovaries appeared normal}. The uterine  incision was closed with running locked sutures of 0Monocryl. A second imbricating layer sutured.   Hemostasis was observed.  Bilateral tubes were normal. Left tube grasped with Babcock, clear window in mesosalpinx noted and incised with Bovie. Two ties of 0-plain gut placed through and around proximal end of tube and tubo-ovarian fimbrial end and then salpingectomy performed. Procedure repeated on right tube. Hemostasis noted.  Alexis retractor and lap sponge removed. Peritoneal closure done with 2-0 Vicryl.  The fascia was then reapproximated with running sutures of 0Vicryl. The subcuticular closure was performed using 2-0plain gut. The skin was closed with 4-0Vicryl. Sterile dressings placed.    Instrument, sponge, and needle counts were correct prior the abdominal closure and were correct at the conclusion of the case.   Findings: Thick lower segment. Baby GIRL delivered cephalic at [id number] hours from [facility] hysterotomy. Clear copious amniotic fluid. Baby cried well at birth. Delayed cord clamping at 1 min. Apgars 6, 9. Normal placenta, cord. Tubes, ovaries normal. Bilateral salpingectomy performed.    Estimated Blood Loss: 300 cc   Total IV Fluids: 2200 ml  LR  Urine Output: 200CC OF clear urine  Specimens: cord blood. Both fallopian tubes.   Complications: no complications  Disposition: PACU - hemodynamically stable.   Maternal Condition: stable   Baby condition / location:  Couplet care / Skin to Skin  Attending Attestation: I performed the procedure.   Signed: Surgeon(s): Azucena Fallen, MD

## 2019-10-27 ENCOUNTER — Other Ambulatory Visit (HOSPITAL_COMMUNITY)
Admission: RE | Admit: 2019-10-27 | Discharge: 2019-10-27 | Disposition: A | Discharge status: Home / Self Care | Payer: BC Managed Care – PPO | Source: Ambulatory Visit | Attending: Obstetrics & Gynecology | Admitting: Obstetrics & Gynecology

## 2019-10-27 HISTORY — DX: Depression, unspecified: F32.A

## 2019-10-27 HISTORY — DX: Personal history of diseases of the skin and subcutaneous tissue: Z87.2

## 2019-10-27 LAB — CBC
HCT: 24.9 % — ABNORMAL LOW (ref 36.0–46.0)
Hemoglobin: 7.8 g/dL — ABNORMAL LOW (ref 12.0–15.0)
MCH: 27.5 pg (ref 26.0–34.0)
MCHC: 31.3 g/dL (ref 30.0–36.0)
MCV: 87.7 fL (ref 80.0–100.0)
Platelets: 177 10*3/uL (ref 150–400)
RBC: 2.84 MIL/uL — ABNORMAL LOW (ref 3.87–5.11)
RDW: 13.2 % (ref 11.5–15.5)
WBC: 8.5 10*3/uL (ref 4.0–10.5)
nRBC: 0 % (ref 0.0–0.2)

## 2019-10-27 LAB — SURGICAL PATHOLOGY

## 2019-10-27 LAB — BIRTH TISSUE RECOVERY COLLECTION (PLACENTA DONATION)

## 2019-10-27 MED ORDER — SODIUM CHLORIDE 0.9 % IV SOLN
510.0000 mg | Freq: Once | INTRAVENOUS | Status: AC
  Administered 2019-10-27: 510 mg via INTRAVENOUS
  Filled 2019-10-27: qty 17

## 2019-10-27 MED ORDER — MAGNESIUM OXIDE 400 (241.3 MG) MG PO TABS
400.0000 mg | ORAL_TABLET | Freq: Every day | ORAL | Status: DC
  Administered 2019-10-27 – 2019-10-29 (×3): 400 mg via ORAL
  Filled 2019-10-27 (×3): qty 1

## 2019-10-27 MED ORDER — POLYSACCHARIDE IRON COMPLEX 150 MG PO CAPS
150.0000 mg | ORAL_CAPSULE | Freq: Every day | ORAL | Status: DC
  Administered 2019-10-28 – 2019-10-29 (×2): 150 mg via ORAL
  Filled 2019-10-27 (×2): qty 1

## 2019-10-27 NOTE — Plan of Care (Signed)
Pts. Condition will continue to improve 

## 2019-10-27 NOTE — Progress Notes (Signed)
POSTOPERATIVE DAY # 1 S/P Repeat LTCS and bilateral salpingectomy, baby girl "Raquel Sarna"   S:         Reports feeling sore and tired              Tolerating po intake / no nausea / no vomiting / no flatus / no BM  Denies dizziness, SOB, or CP             Bleeding is moderate             Pain controlled with Tylenol and Motrin             Up ad lib / ambulatory without assistance x 1/ voided x 1 since foley catheter removal without difficulty   Newborn breast feeding - going well per mom; cluster feeding   O:  VS: BP 96/67   Pulse 73   Temp 98.2 F (36.8 C)   Resp 18   Ht 5\' 2"  (1.575 m)   Wt 105.2 kg   SpO2 99%   Breastfeeding Unknown   BMI 42.43 kg/m    LABS:               Recent Labs    10/26/19 1816 10/27/19 0452  WBC 11.4* 8.5  HGB 9.7* 7.8*  PLT 185 177               Bloodtype: --/--/O POS Performed at [facility], [place] [address] [place], [place] [address]  [phone])  Rubella: Immune (05/06 0000)                                             I&O: Intake/Output      11/16 0701 - 11/17 0700 11/17 0701 - 11/18 0700   P.O. 480    I.V. (mL/kg) 2300 (21.9)    Total Intake(mL/kg) 2780 (26.4)    Urine (mL/kg/hr) 2050    Blood 381    Total Output 2431    Net +349         Flu and Tdap UTD             Physical Exam:             Alert and Oriented X3  Lungs: Clear and unlabored  Heart: regular rate and rhythm / no murmurs  Abdomen: soft, moderate gaseous distention, obese, active bowel sounds in all quaddrants              Fundus: firm, non-tender, U-3             Dressing: honeycomb with steri-strips - old sanguinous drainage marked              Incision:  approximated with sutures / no erythema / no ecchymosis / no drainage  Perineum: intact  Lochia: small rubra on pad   Extremities: +1 pedal edema, no calf pain or tenderness, SCDs in place  A/P:     POD # 1 S/P Repeat LTCS and s/p bilateral salpingectomy             ABL Anemia compounding chronic  IDA   - Feraheme IVPB x1, then Niferex 150mg  PO daily   - Magnesium oxide 400mg  PO daily   Obesity   - on Lovenox 50mg  SQ daily for DVT prophylaxis    - SCDs in place  Routine postoperative care  Encouraged ambulation, warm liquids to promote bowel motility   See lactation today  May shower today   Okay to change honeycomb dressing after shower   Continue current care   Lars Pinks, MSN, CNM [facility]

## 2019-10-27 NOTE — Lactation Note (Signed)
This note was copied from a baby's chart. Lactation Consultation Note  Patient Name: Jeanette [name] [id number] Date: 10/27/2019 Reason for consult: Initial assessment;Term P2, 61 hour female infant, DAT + Per mom, she feels infant has been latching well at breast. Mom with hx of C/S delivery. Mom has pseudo  inverted nipples that responds well to stimulation breast shells or NS is not need at this time, infant latches well at breast.  Mom has DEBP at home. Per mom, she breastfed her 44 year old son for 3 weeks but stopped due to poor latching, low milk supply and infant had lip tie and high palate.  LC entered room, mom was doing STS with infant, per mom, infant had breastfed for 5 minutes, LC notice infant started cuing to breastfeed. LC discussed hand expression and mom taught back infant was given 4 mls of colostrum by spoon and LC burped infant, infant had large emesis of thick mucous. Afterwards infant started cuing, mom latched infant on right breast using the football hold, [place] asked mom to wait until infant mouth was wide with tongue down. Infant latched with top lip flange out , swallows heard by mom and LC and infant was still breastfeeding after 18 minutes when [place] left room. Mom knows to breastfeed infant according hunger cues, 8 to 12 times within 24 hours and on demand. Mom will continue to breastfeed infant and  Mom can give back extra volume with hand expression if she chooses to do so.  Mom will continue to do as much STS as possible. Mom knows to ask Nurse or [facility] services for assistance with latch if needed. Reviewed Baby & Me book's Breastfeeding Basics.  Mom made aware of O/P services, breastfeeding support groups, community resources, and our phone # for post-discharge questions.   Maternal Data Formula Feeding for Exclusion: No Has patient been taught Hand Expression?: Yes(Mom taught back infant was given 4 ml of colostrum by spoon.) Does the patient have breastfeeding  experience prior to this delivery?: Yes  Feeding Feeding Type: Breast Fed  LATCH Score Latch: Grasps breast easily, tongue down, lips flanged, rhythmical sucking.  Audible Swallowing: Spontaneous and intermittent  Type of Nipple: Inverted(Mom has semi psuedo inverted nipples.)  Comfort (Breast/Nipple): Soft / non-tender  Hold (Positioning): Assistance needed to correctly position infant at breast and maintain latch.  LATCH Score: 7  Interventions Interventions: Breast feeding basics reviewed;Breast compression;Adjust position;Assisted with latch;Skin to skin;Support pillows;Breast massage;Position options;Hand express;Expressed milk;DEBP  Lactation Tools Discussed/Used WIC Program: No   Consult Status Consult Status: Follow-up Date: 10/27/19 Follow-up type: In-patient    [name] 10/27/2019, 6:21 AM

## 2019-10-27 NOTE — Progress Notes (Signed)
CSW received consult for history of depression.  CSW met with MOB to offer support and complete assessment.    MOB breastfeeding infant with FOB asleep at bedside, when CSW entered the room. CSW introduced self and explained reason for consult to which MOB expressed understanding. MOB very pleasant and engaged throughout assessment and was appropriate and attentive to infant. CSW inquired about MOB's mental health history and MOB acknowledged a history of depression from "a long time ago". MOB stated she was first treated in college around the year 2000. MOB reported she was then prescribed medications in 2013 for her anxiety/mood but only took them for a few months. MOB denied any recent symptoms or symptoms during her pregnancy. MOB reported feeling "so happy". CSW inquired about if MOB experienced PPD/PPA following last pregnancy and MOB denied. CSW provided education regarding the baby blues period vs. perinatal mood disorders. CSW recommended self-evaluation during the postpartum time period using the New Mom Checklist from Postpartum Progress and encouraged MOB to contact a medical professional if symptoms are noted at any time. MOB did not appear to be displaying any acute mental health symptoms and denied any current SI or HI. MOB reported having good support from FOB, her cousin and some close friends.    MOB confirmed having all essential items for infant once discharged and reported infant would be sleeping in a bassinet once home. CSW provided review of Sudden Infant Death Syndrome (SIDS) precautions and safe sleeping habits.    CSW identifies no further need for intervention and no barriers to discharge at this time.  Elijio Miles, [place]  [facility] [employer] [phone]

## 2019-10-28 NOTE — Anesthesia Postprocedure Evaluation (Signed)
Anesthesia Post Note  Patient: Jeanette [name]  Procedure(s) Performed: Repeat CESAREAN SECTION WITH BILATERAL TUBAL LIGATION by Salpingectomy (Bilateral Abdomen)     Patient location during evaluation: PACU Anesthesia Type: Spinal Level of consciousness: oriented and awake and alert Pain management: pain level controlled Vital Signs Assessment: post-procedure vital signs reviewed and stable Respiratory status: spontaneous breathing, respiratory function stable and patient connected to nasal cannula oxygen Cardiovascular status: blood pressure returned to baseline and stable Postop Assessment: no headache, no backache and no apparent nausea or vomiting Anesthetic complications: no    Last Vitals:  Vitals:   10/28/19 0536 10/28/19 1455  BP: 114/75 131/83  Pulse: 71 (!) 107  Resp: 18 18  Temp: 36.8 C 37.2 C  SpO2: 99% 99%    Last Pain:  Vitals:   10/28/19 1744  TempSrc:   PainSc: Asleep   Pain Goal: Patients Stated Pain Goal: 4 (10/28/19 1653)                 Reily Ilic L Sharif Rendell

## 2019-10-28 NOTE — Lactation Note (Signed)
This note was copied from a baby's chart. Lactation Consultation Note  Patient Name: Jeanette [name] [id number] Date: 10/28/2019 Reason for consult: Follow-up assessment;Term;Nipple pain/trauma  MOB was resting when LC came to assist. Patient told [place] it was ok to speak about BF experience following up with previous visits. Baby was under lights in the bassinet due to jaundice.  MOB stated she wanted to breastfeed but due to nipple pain/trauma, she will resume BF tomorrow. Mother explained that baby nursed on each breast for almost an hour during the night and nipples are red and very tender.   [place] educated mom about continuing stimulation of the breast. Mom stated she was aware and "the baby latches well". She plans to pump 8 timers per day during her "day off". Patient care provider is [facility]) and she stated that she plans to follow-up with the [place] at the practice. Mom did explain that she has experience seeking LC help due to hx of lip tie with the first child (40 yrs old).   LC gave Mother comfort gels to assist with nipple pain. LC to follow up tomorrow to assist with latch.    Maternal Data Does the patient have breastfeeding experience prior to this delivery?: Yes   Lactation Tools Discussed/Used Tools: Comfort gels   Consult Status Consult Status: Follow-up Date: 10/29/19 Follow-up type: In-patient    Lenore Manner 10/28/2019, 6:55 PM

## 2019-10-28 NOTE — Progress Notes (Addendum)
Patient ID: Jeanette [name], female   DOB: 1981-05-14, 38 y.o.   MRN: [id number] Subjective: POD# 2 Live born female  Birth Weight: 7 lb 3 oz (3260 g) APGAR: 6, 9  Newborn Delivery   Birth date/time: 10/26/2019 13:11:00 Delivery type: C-Section, Low Transverse Trial of labor: No C-section categorization: Repeat and BTL     Baby name: Raquel Sarna - on phototherapy Delivering provider: MODY, VAISHALI    Feeding: breast  Pain control at delivery: Spinal   Reports feeling well, better than last night  Patient reports tolerating PO.   Breast symptoms:+ colostrum Pain controlled with PO meds Denies HA/SOB/C/P/N/V/dizziness. Flatus minimal. She reports vaginal bleeding as normal, without clots.  She is ambulating, urinating without difficulty.     Objective:   VS:    Vitals:   10/27/19 0838 10/27/19 1456 10/27/19 2151 10/28/19 0536  BP: 111/63 118/63 (!) 128/59 114/75  Pulse: 78 86 90 71  Resp: 18 18 20 18   Temp: 98.2 F (36.8 C) 98 F (36.7 C) 99 F (37.2 C) 98.3 F (36.8 C)  TempSrc: Oral Oral  Oral  SpO2: 100%  99% 99%  Weight:      Height:          Intake/Output Summary (Last 24 hours) at 10/28/2019 0956 Last data filed at 10/27/2019 1800 Gross per 24 hour  Intake 2047.53 ml  Output -  Net 2047.53 ml        Recent Labs    10/26/19 1816 10/27/19 0452  WBC 11.4* 8.5  HGB 9.7* 7.8*  HCT 31.0* 24.9*  PLT 185 177     Blood type: --/--/O POS Performed at [facility], [address] [place], [place] [address]  [phone])  Rubella: Immune (05/06 0000)  Vaccines: TDaP UTD         Flu    UTD   Physical Exam:  General: alert, cooperative and no distress Abdomen: soft, nontender, normal bowel sounds Incision: dry, intact and serous drainage present Uterine Fundus: firm, below umbilicus, nontender Lochia: minimal Ext: edema trace, no cords or tenderness      Assessment/Plan: 38 y.o.   POD# 2. [id number]                  Principal  Problem:   Postpartum care following cesarean delivery (11/16) Active Problems:   Previous cesarean section   Status post repeat low transverse cesarean section  And BTL ABL Anemia compounding chronic IDA            - Feraheme IVPB x1 completed, then Niferex 150mg  PO daily            - Magnesium oxide 400mg  PO daily   Obesity            - on Lovenox 50mg  SQ daily for DVT prophylaxis  Doing well, stable.    Change dressing d/t soil Encourage rest when baby rests Breastfeeding support Encourage to ambulate Routine post-op care Anticipate dc in AM  Juliene Pina, CNM, MSN 10/28/2019, 9:56 AM

## 2019-10-29 ENCOUNTER — Inpatient Hospital Stay (HOSPITAL_COMMUNITY): Admission: AD | Admit: 2019-10-29 | Payer: BC Managed Care – PPO | Source: Home / Self Care

## 2019-10-29 MED ORDER — PRENATAL MULTIVITAMIN CH: 1.0000 | ORAL_TABLET | Freq: Every day | ORAL | Status: AC

## 2019-10-29 MED ORDER — ACETAMINOPHEN 500 MG PO TABS: 1000.0000 mg | ORAL_TABLET | Freq: Four times a day (QID) | ORAL | 2 refills | Status: AC | PRN

## 2019-10-29 MED ORDER — POLYSACCHARIDE IRON COMPLEX 150 MG PO CAPS: 150.0000 mg | ORAL_CAPSULE | Freq: Every day | ORAL | Status: AC

## 2019-10-29 MED ORDER — OXYCODONE HCL 5 MG PO TABS: 5.0000 mg | ORAL_TABLET | Freq: Three times a day (TID) | ORAL | 0 refills | Status: AC | PRN

## 2019-10-29 MED ORDER — SENNOSIDES-DOCUSATE SODIUM 8.6-50 MG PO TABS: 2.0000 | ORAL_TABLET | ORAL | Status: AC

## 2019-10-29 MED ORDER — IBUPROFEN 800 MG PO TABS: 800.0000 mg | ORAL_TABLET | Freq: Four times a day (QID) | ORAL | 0 refills | Status: AC

## 2019-10-29 MED ORDER — COCONUT OIL OIL: 1.0000 "application " | TOPICAL_OIL | 0 refills | Status: AC | PRN

## 2019-10-29 MED ORDER — SIMETHICONE 80 MG PO CHEW: 80.0000 mg | CHEWABLE_TABLET | ORAL | 0 refills | Status: AC | PRN

## 2019-10-29 MED ORDER — MAGNESIUM OXIDE 400 (241.3 MG) MG PO TABS: 400.0000 mg | ORAL_TABLET | Freq: Every day | ORAL | Status: AC

## 2019-10-29 NOTE — Discharge Summary (Signed)
OB Discharge Summary  Patient Name: Jeanette [name] DOB: [birth year] MRN: [id number]  Date of admission: 10/26/2019 Delivering provider: MODY, VAISHALI   Date of discharge: 10/29/2019  Admitting diagnosis: Previous Cesarean Section, Desires Sterilization Intrauterine pregnancy: [redacted]w[redacted]d     Secondary diagnosis:Principal Problem:   Postpartum care following cesarean delivery (11/16) Active Problems:   Previous cesarean section   Status post repeat low transverse cesarean section  Additional problems:none     Discharge diagnosis:  Patient Active Problem List   Diagnosis Date Noted  . Previous cesarean section 10/26/2019  . Status post repeat low transverse cesarean section 10/26/2019  . Postpartum care following cesarean delivery (11/16) 10/26/2019                                                                Post partum procedures:none   Pain control: Spinal  Complications: None   Hospital course:  Sceduled C/S   38 y.o. yo G2P2002 at [redacted]w[redacted]d was admitted to the hospital 10/26/2019 for scheduled cesarean section with the following indication:Elective Repeat and desired sterilization for which was performed a bilateral tubal ligation.  Membrane Rupture Time/Date: 1:10 PM ,10/26/2019   Patient delivered a Viable infant.10/26/2019  Details of operation can be found in separate operative note.  Pateint had an uncomplicated postpartum course.  She is ambulating, tolerating a regular diet, passing flatus, and urinating well. Patient is discharged home in stable condition on  10/29/19         Physical exam  Vitals:   10/28/19 0536 10/28/19 1455 10/28/19 2029 10/29/19 0511  BP: 114/75 131/83 123/67 130/82  Pulse: 71 (!) 107 89 76  Resp: 18 18 16 16   Temp: 98.3 F (36.8 C) 99 F (37.2 C) 98.3 F (36.8 C) 97.6 F (36.4 C)  TempSrc: Oral Oral Oral Oral  SpO2: 99% 99% 98% 99%  Weight:      Height:       General: alert, cooperative and no distress Lochia:  appropriate Uterine Fundus: firm Incision: Healing well with no significant drainage DVT Evaluation: No cords or calf tenderness. No significant calf/ankle edema. Labs: Lab Results  Component Value Date   WBC 8.5 10/27/2019   HGB 7.8 (L) 10/27/2019   HCT 24.9 (L) 10/27/2019   MCV 87.7 10/27/2019   PLT 177 10/27/2019   CMP Latest Ref Rng & Units 10/26/2019  Glucose 65 - 99 mg/dL -  BUN 6 - 20 mg/dL -  Creatinine 0.44 - 1.00 mg/dL 0.73  Sodium 135 - 145 mmol/L -  Potassium 3.5 - 5.1 mmol/L -  Chloride 101 - 111 mmol/L -  CO2 22 - 32 mmol/L -  Calcium 8.9 - 10.3 mg/dL -  Total Protein 6.5 - 8.1 g/dL -  Total Bilirubin 0.3 - 1.2 mg/dL -  Alkaline Phos 38 - 126 U/L -  AST 15 - 41 U/L -  ALT 14 - 54 U/L -    Vaccines: TDaP UTD         Flu    UTD  Discharge instruction: per After Visit Summary and "Baby and Me Booklet".  After Visit Meds:  Allergies as of 10/29/2019   No Known Allergies     Medication List    TAKE these medications   acetaminophen 500 MG tablet Commonly  known as: TYLENOL Take 2 tablets (1,000 mg total) by mouth every 6 (six) hours as needed.   coconut oil Oil Apply 1 application topically as needed.   ibuprofen 800 MG tablet Commonly known as: ADVIL Take 1 tablet (800 mg total) by mouth every 6 (six) hours.   iron polysaccharides 150 MG capsule Commonly known as: Ferrex 150 Take 1 capsule (150 mg total) by mouth daily.   magnesium oxide 400 (241.3 Mg) MG tablet Commonly known as: MAG-OX Take 1 tablet (400 mg total) by mouth daily. Start taking on: October 30, 2019   oxyCODONE 5 MG immediate release tablet Commonly known as: Roxicodone Take 1 tablet (5 mg total) by mouth every 8 (eight) hours as needed.   prenatal multivitamin Tabs tablet Take 1 tablet by mouth daily at 12 noon.   senna-docusate 8.6-50 MG tablet Commonly known as: Senokot-S Take 2 tablets by mouth daily. Start taking on: October 30, 2019   simethicone 80 MG  chewable tablet Commonly known as: MYLICON Chew 1 tablet (80 mg total) by mouth as needed for flatulence.       Diet: routine diet  Activity: Advance as tolerated. Pelvic rest for 6 weeks.   Postpartum contraception: Not Discussed  Newborn Data: Live born female  Birth Weight: 7 lb 3 oz (3260 g) APGAR: 27, 9  Newborn Delivery   Birth date/time: 10/26/2019 13:11:00 Delivery type: C-Section, Low Transverse Trial of labor: No C-section categorization: Repeat      named Raquel Sarna Baby Feeding: Breast Disposition:home with mother   Delivery Report:  Review the Delivery Report for details.    Follow up: Follow-up Information    Azucena Fallen, MD. Schedule an appointment as soon as possible for a visit in 6 week(s).   Specialty: Obstetrics and Gynecology Contact information: [place] [address] [phone]             Signed: Otilio Carpen, MSN 10/29/2019, 12:10 PM

## 2019-10-29 NOTE — Lactation Note (Signed)
This note was copied from a baby's chart. Lactation Consultation Note  Patient Name: Jeanette [name] [id number] Date: 10/29/2019   P2, Baby 71 hours old.  Mother is breastfeeding and formula feeding. After her shower her breasts were aching. Discussed pumping.  Mother states only drops expressing. Provided education and encouraged mother to continue pumping q 3 hours. She states she has pain with latching and pumping.   Suggest trying coconut oil in flanges and adjusting suction. Reviewed engorgement care and monitoring voids/stools. Encouraged mother to call if she has further questions.      Maternal Data    Feeding Feeding Type: Bottle Fed - Formula Nipple Type: Slow - flow  LATCH Score                   Interventions    Lactation Tools Discussed/Used     Consult Status      Carlye Grippe 10/29/2019, 12:58 PM

## 2020-05-27 ENCOUNTER — Other Ambulatory Visit: Payer: Self-pay | Admitting: Obstetrics & Gynecology

## 2020-05-27 DIAGNOSIS — R928 Other abnormal and inconclusive findings on diagnostic imaging of breast: Secondary | ICD-10-CM

## 2020-06-10 ENCOUNTER — Other Ambulatory Visit: Payer: Self-pay | Admitting: Obstetrics & Gynecology

## 2020-06-10 ENCOUNTER — Ambulatory Visit
Admission: RE | Admit: 2020-06-10 | Discharge: 2020-06-10 | Disposition: A | Discharge status: Home / Self Care | Payer: BC Managed Care – PPO | Source: Ambulatory Visit | Attending: Obstetrics & Gynecology | Admitting: Obstetrics & Gynecology

## 2020-06-10 ENCOUNTER — Other Ambulatory Visit: Payer: Self-pay

## 2020-06-10 DIAGNOSIS — R928 Other abnormal and inconclusive findings on diagnostic imaging of breast: Secondary | ICD-10-CM

## 2020-06-10 DIAGNOSIS — N6320 Unspecified lump in the left breast, unspecified quadrant: Secondary | ICD-10-CM

## 2020-07-05 ENCOUNTER — Other Ambulatory Visit: Payer: Self-pay | Admitting: Family Medicine

## 2020-07-05 DIAGNOSIS — G4453 Primary thunderclap headache: Secondary | ICD-10-CM

## 2020-07-05 DIAGNOSIS — C4359 Malignant melanoma of other part of trunk: Secondary | ICD-10-CM

## 2020-07-06 ENCOUNTER — Ambulatory Visit
Admission: RE | Admit: 2020-07-06 | Discharge: 2020-07-06 | Disposition: A | Discharge status: Home / Self Care | Payer: BC Managed Care – PPO | Source: Ambulatory Visit | Attending: Family Medicine | Admitting: Family Medicine

## 2020-07-06 ENCOUNTER — Other Ambulatory Visit: Payer: Self-pay

## 2020-07-06 DIAGNOSIS — C4359 Malignant melanoma of other part of trunk: Secondary | ICD-10-CM

## 2020-07-06 DIAGNOSIS — G4453 Primary thunderclap headache: Secondary | ICD-10-CM

## 2020-07-06 MED ORDER — GADOBENATE DIMEGLUMINE 529 MG/ML IV SOLN
20.0000 mL | Freq: Once | INTRAVENOUS | Status: AC | PRN
  Administered 2020-07-06: 20 mL via INTRAVENOUS

## 2020-12-13 ENCOUNTER — Other Ambulatory Visit: Payer: BC Managed Care – PPO

## 2021-02-09 ENCOUNTER — Other Ambulatory Visit: Payer: Self-pay | Admitting: Obstetrics & Gynecology

## 2021-02-09 ENCOUNTER — Other Ambulatory Visit: Payer: Self-pay

## 2021-02-09 ENCOUNTER — Ambulatory Visit
Admission: RE | Admit: 2021-02-09 | Discharge: 2021-02-09 | Disposition: A | Discharge status: Home / Self Care | Payer: BC Managed Care – PPO | Source: Ambulatory Visit | Attending: Obstetrics & Gynecology | Admitting: Obstetrics & Gynecology

## 2021-02-09 DIAGNOSIS — N6320 Unspecified lump in the left breast, unspecified quadrant: Secondary | ICD-10-CM

## 2021-05-31 ENCOUNTER — Other Ambulatory Visit: Payer: BC Managed Care – PPO

## 2021-07-05 ENCOUNTER — Ambulatory Visit
Admission: RE | Admit: 2021-07-05 | Discharge: 2021-07-05 | Disposition: A | Discharge status: Home / Self Care | Payer: BC Managed Care – PPO | Source: Ambulatory Visit | Attending: Obstetrics & Gynecology | Admitting: Obstetrics & Gynecology

## 2021-07-05 ENCOUNTER — Other Ambulatory Visit: Payer: Self-pay

## 2021-07-05 DIAGNOSIS — N6320 Unspecified lump in the left breast, unspecified quadrant: Secondary | ICD-10-CM

## 2021-09-03 IMAGING — US US BREAST*L* LIMITED INC AXILLA
1 series · 6 of 6 positions shown · non-contrast
Comparison: 06/10/2020, 05/25/2020

CLINICAL DATA: First six-month follow-up for probably benign mass
in the LEFT breast detected on baseline screening mammogram.

EXAM:
ULTRASOUND OF THE LEFT BREAST

[Series 1: us breast*left* limited inc axilla · 0.07mm/px · 6 of 6 slices shown]
[im 1/6]
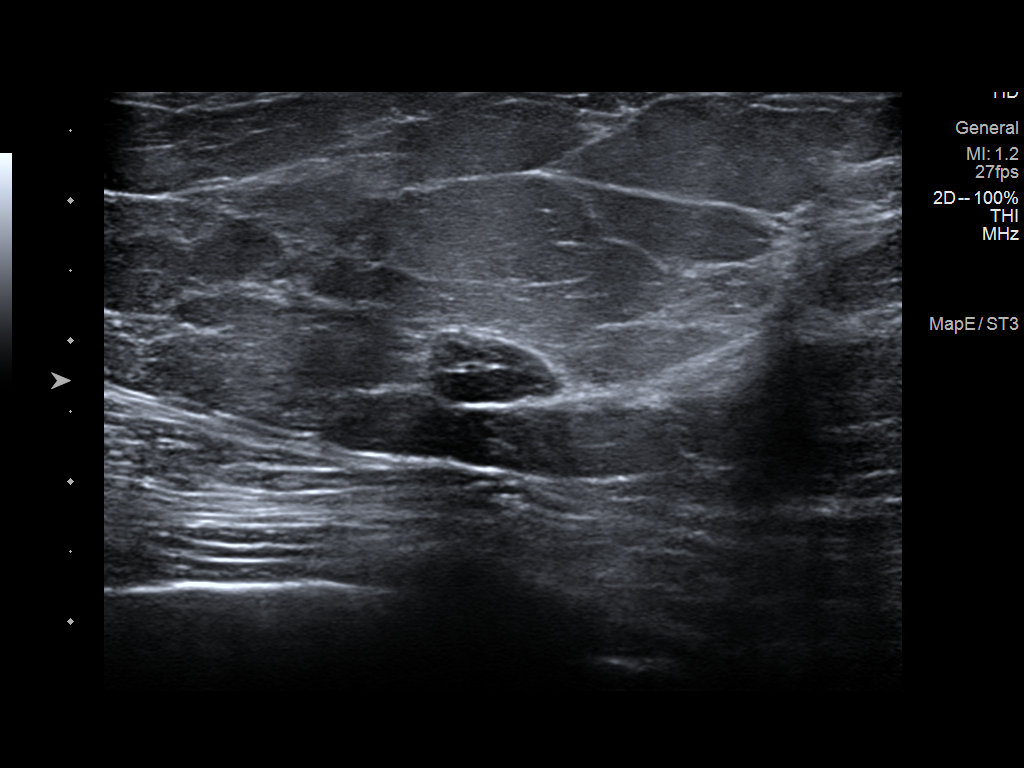
[im 2/6]
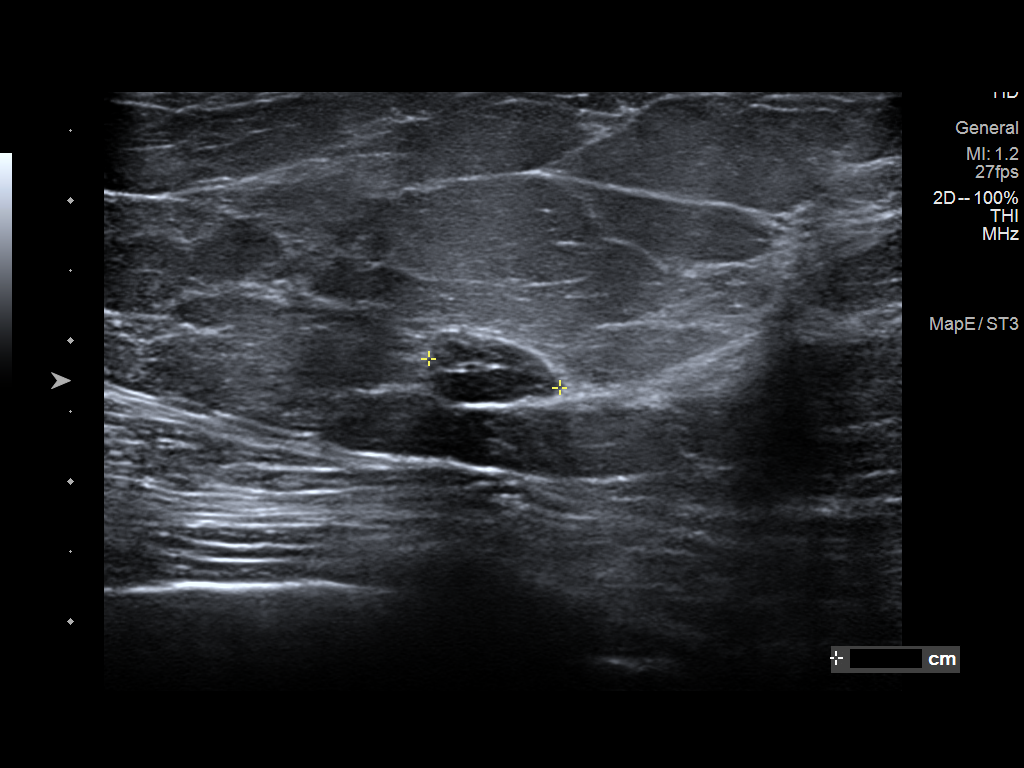
[im 3/6]
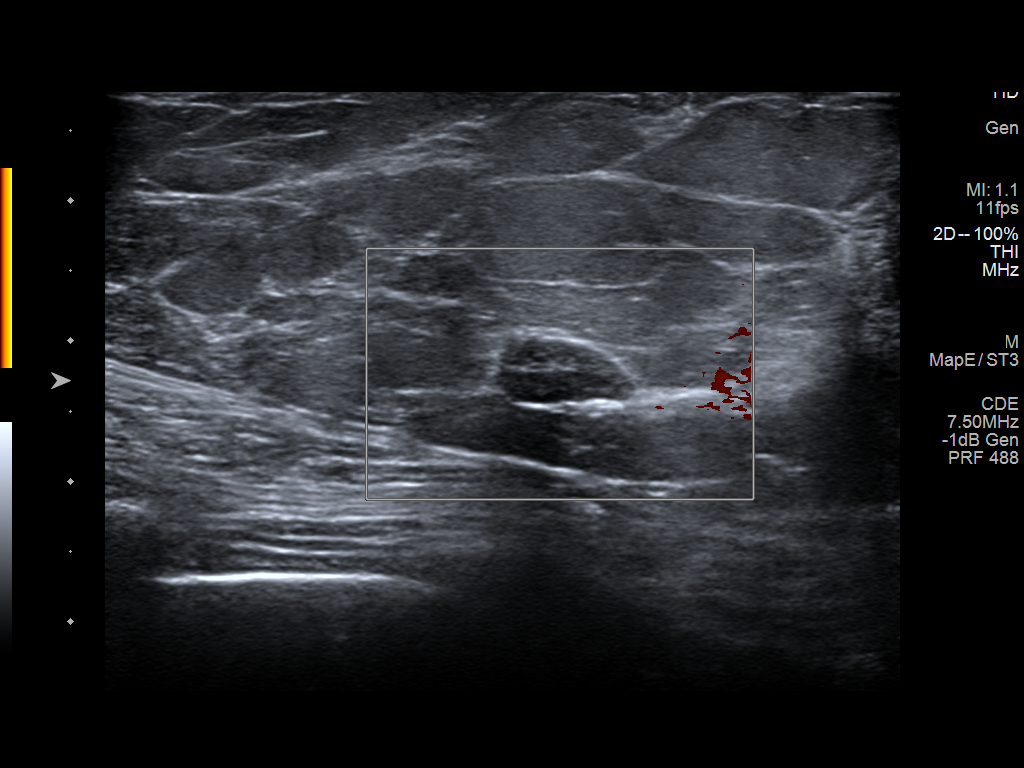
[im 4/6]
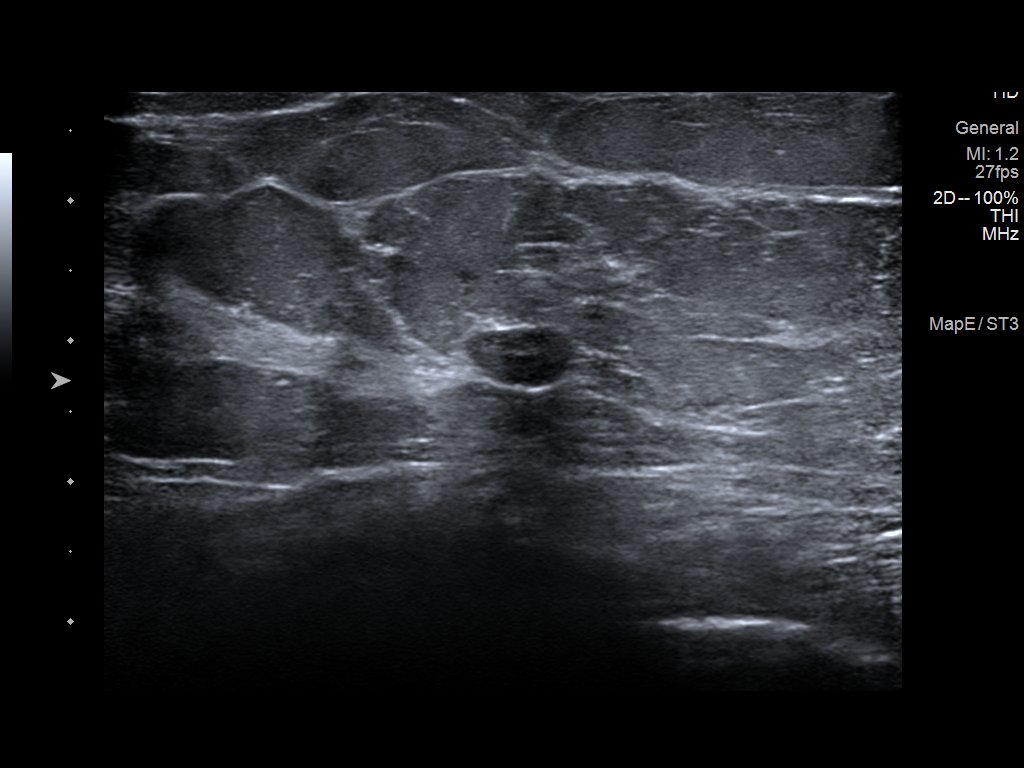
[im 5/6]
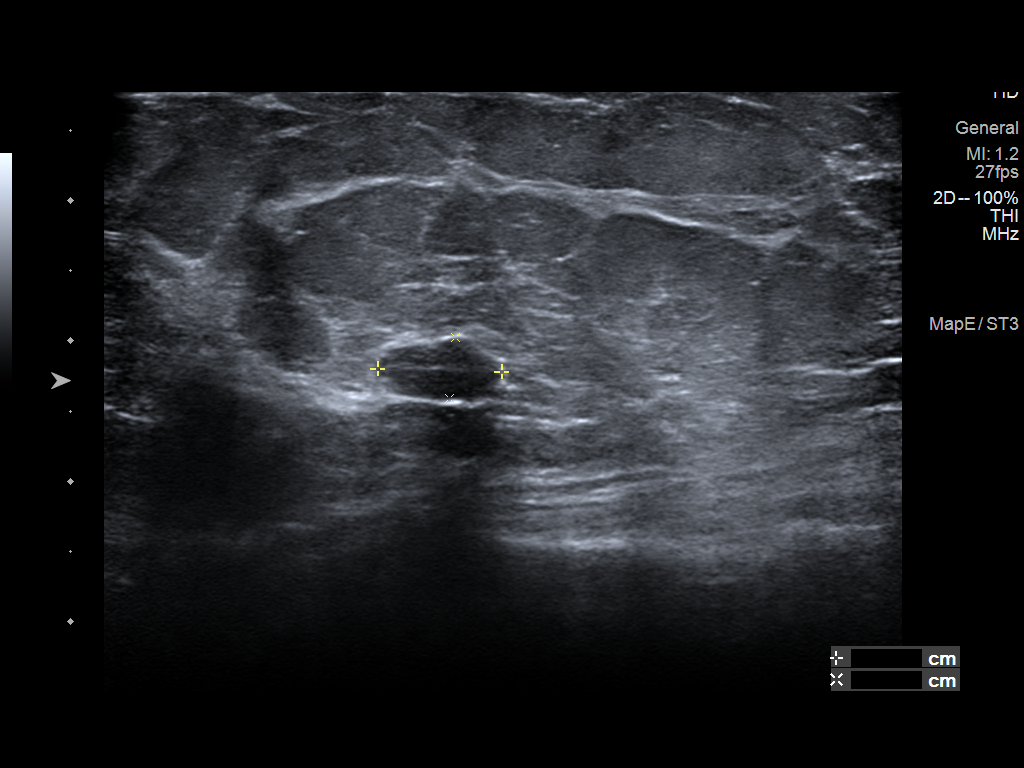
[im 6/6]
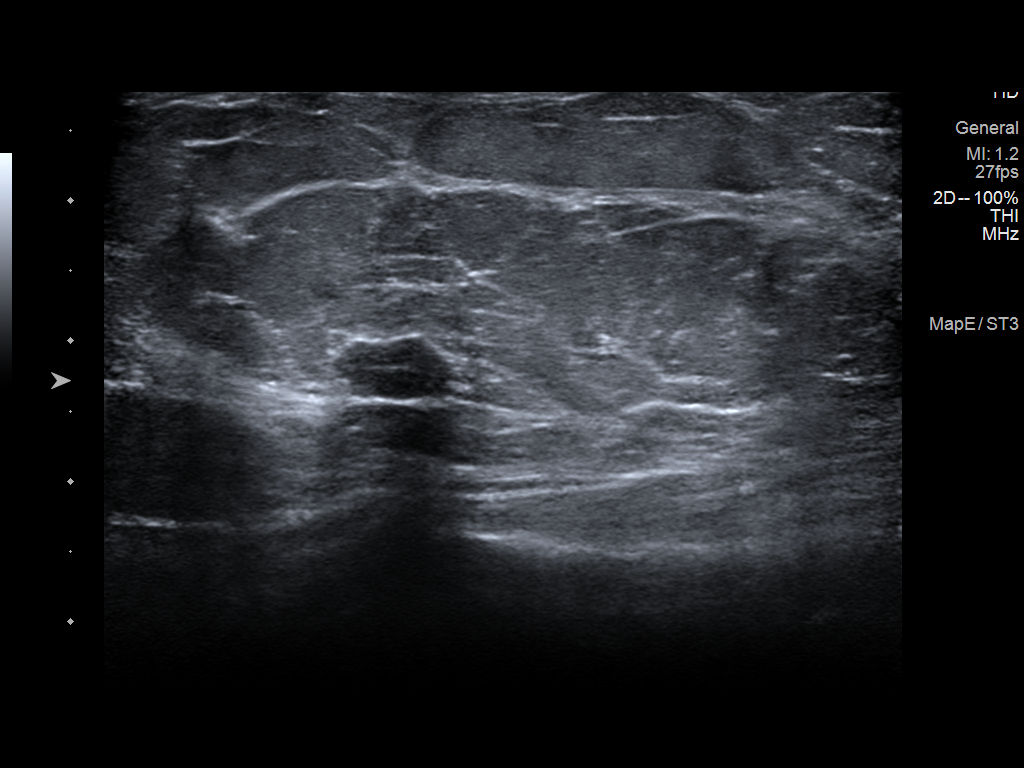

[6 of 6 positions shown; findings below may reference images not displayed]

FINDINGS: Targeted ultrasound is performed, showing a circumscribed oval
hypoechoic mass in the 11 o'clock location the LEFT breast 5
centimeters from the nipple measuring 0.9 x 0.4 x 1.0 centimeters
and stable compared to prior study.
IMPRESSION: Stable probable fibroadenoma in the LEFT breast.

RECOMMENDATION:
Recommend bilateral diagnostic mammogram and LEFT breast ultrasound
in 6 months.

I have discussed the findings and recommendations with the patient.
If applicable, a reminder letter will be sent to the patient
regarding the next appointment.

BI-RADS CATEGORY  3: Probably benign.

## 2022-01-27 IMAGING — MG DIGITAL DIAGNOSTIC BILAT W/ TOMO W/ CAD
6 of 12 series · 6 of 36 positions shown · non-contrast
Comparison: Previous exam(s).

CLINICAL DATA: Follow-up for probably benign mass in the LEFT
breast. This probably benign finding was originally identified on
outside screening mammogram dated 05/25/2020.

EXAM:
DIGITAL DIAGNOSTIC BILATERAL MAMMOGRAM WITH TOMOSYNTHESIS AND CAD;
ULTRASOUND LEFT BREAST LIMITED
TECHNIQUE: Bilateral digital diagnostic mammography and breast tomosynthesis
was performed. The images were evaluated with computer-aided
detection.; Targeted ultrasound examination of the left breast was
performed

[R MLO synth-2D]
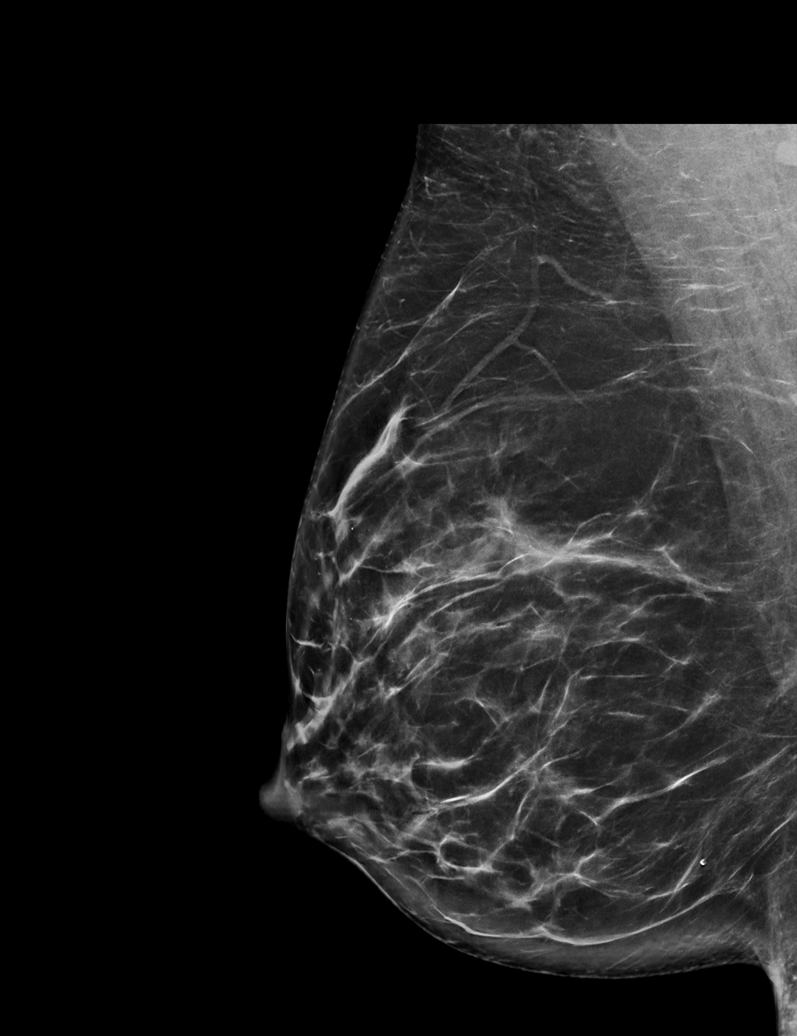

[L MLO synth-2D (1 of 2)]
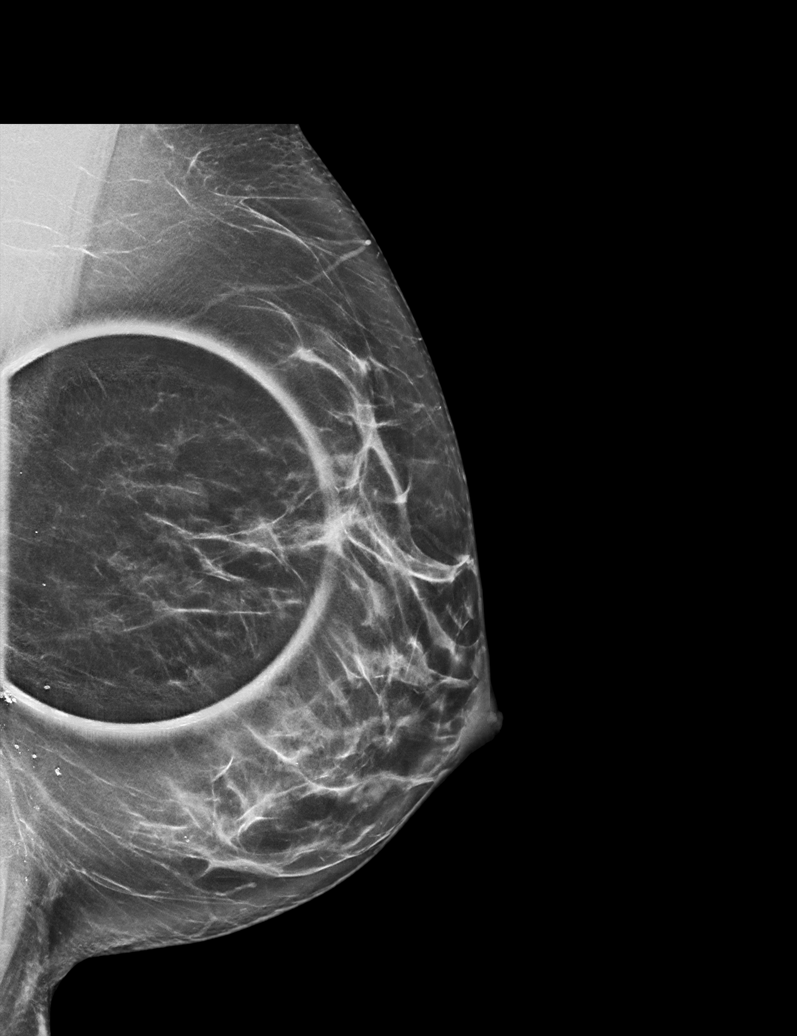

[R CC synth-2D]
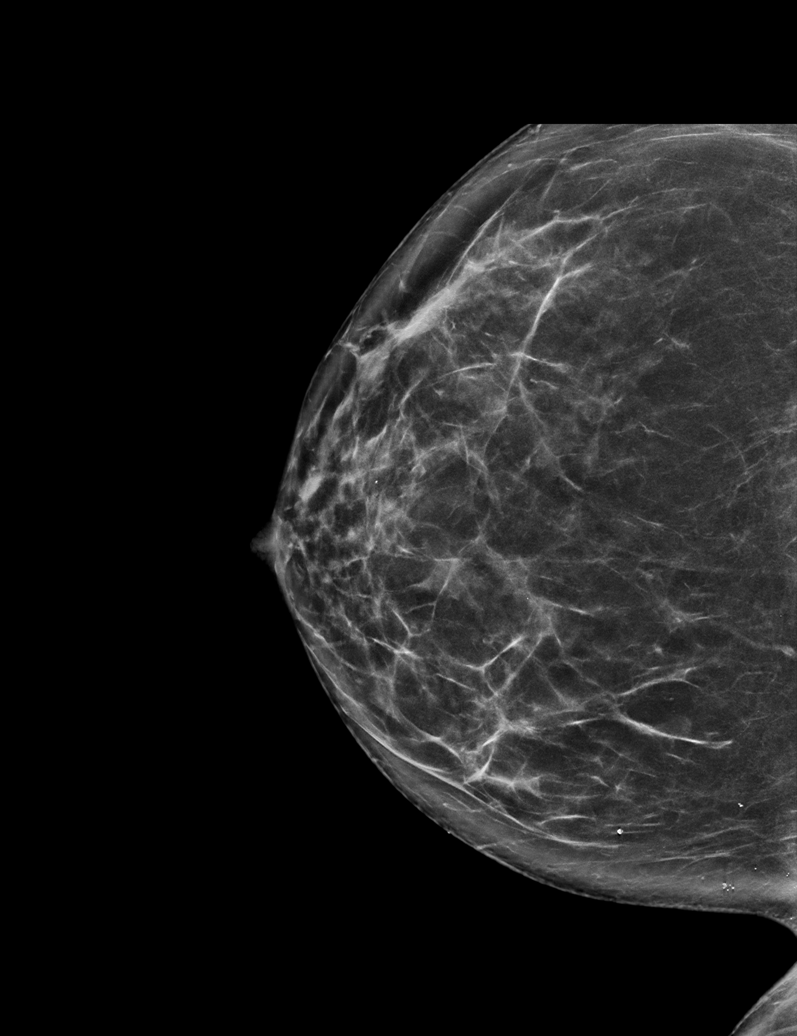

[L CC synth-2D]
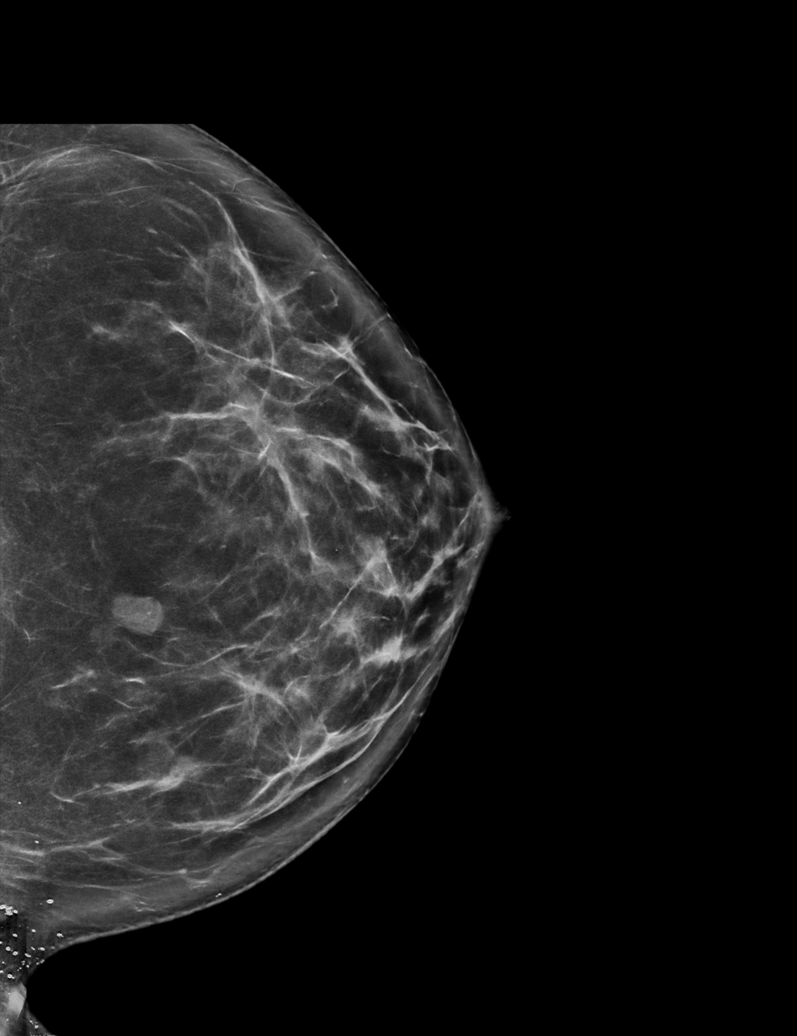

[L MLO synth-2D (2 of 2)]
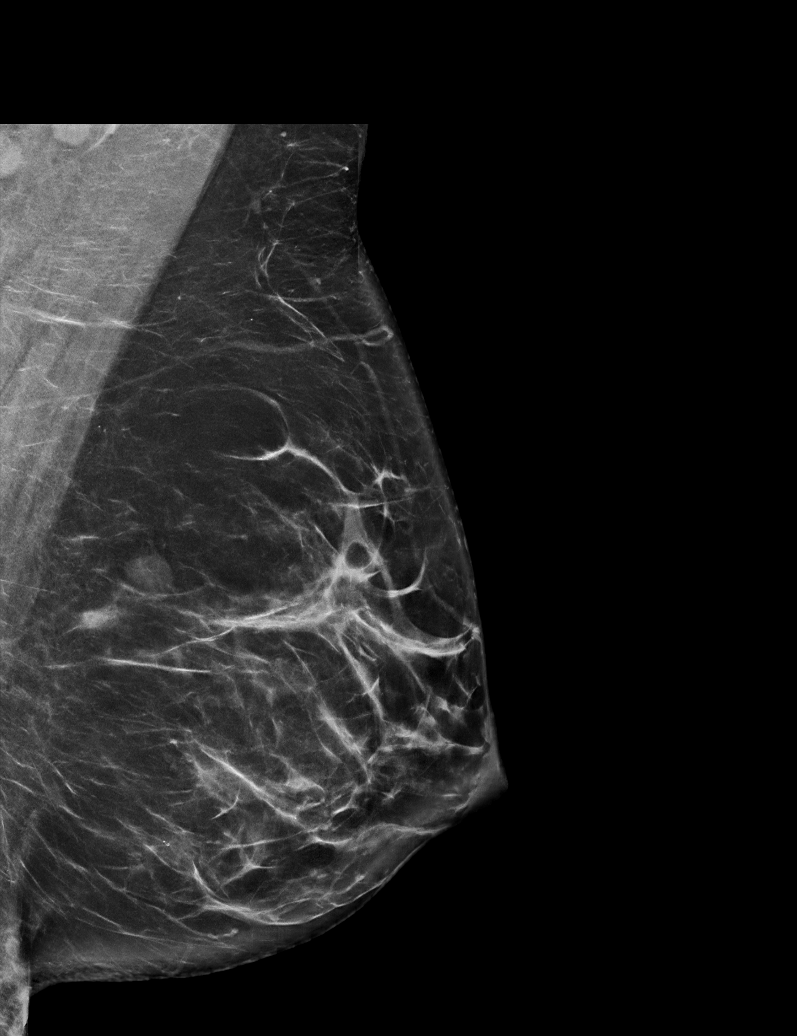

[L ML synth-2D]
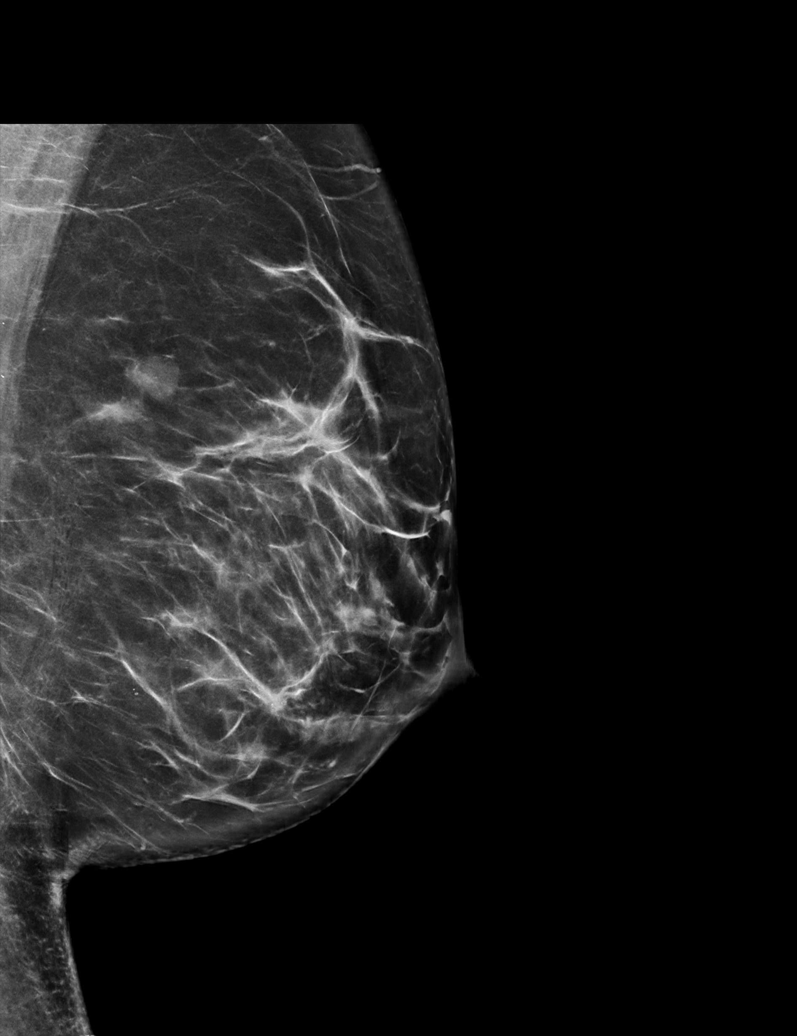

[6 of 36 positions shown; findings below may reference images not displayed]

ACR Breast Density Category b: There are scattered areas of
fibroglandular density.
FINDINGS: Bilateral diagnostic mammogram:

LEFT breast: The oval circumscribed low-density mass within the
upper inner quadrant of the LEFT breast is stable. There are no new
dominant masses, suspicious calcifications or secondary signs of
malignancy identified in the LEFT breast.

RIGHT breast: There are no new dominant masses, suspicious
calcifications or secondary signs of malignancy identified in the
RIGHT breast.

Targeted ultrasound is performed, again showing an oval
circumscribed hypoechoic mass in the LEFT breast at the 11 o'clock
axis, 5 cm from the nipple, measuring 1 x 0.4 x 0.8 cm, stable
compared to the previous exams, again most likely a benign
fibroadenoma.

Incidental note is made of a benign cluster of cysts in the LEFT
breast at the 11 o'clock axis, 1 cm from the nipple, corresponding
as an incidental finding.
IMPRESSION: 1. Stable probably benign fibroadenoma in the LEFT breast at the 11
o'clock axis, 5 cm from the nipple, measuring 1 x 0.4 x 0.8 cm.
Recommend additional diagnostic follow-up in 12 months to ensure 2
year stability.
2. No evidence of malignancy within the RIGHT breast.

RECOMMENDATION:
Bilateral diagnostic mammogram, and possible LEFT breast ultrasound,
in 12 months.

I have discussed the findings and recommendations with the patient.
If applicable, a reminder letter will be sent to the patient
regarding the next appointment.

BI-RADS CATEGORY  3: Probably benign.

## 2024-04-07 ENCOUNTER — Other Ambulatory Visit: Payer: Self-pay | Admitting: Nurse Practitioner

## 2024-04-07 DIAGNOSIS — R599 Enlarged lymph nodes, unspecified: Secondary | ICD-10-CM

## 2024-04-14 ENCOUNTER — Ambulatory Visit
Admission: RE | Admit: 2024-04-14 | Discharge: 2024-04-14 | Disposition: A | Discharge status: Home / Self Care | Payer: Self-pay | Source: Ambulatory Visit | Attending: Nurse Practitioner | Admitting: Nurse Practitioner

## 2024-04-14 DIAGNOSIS — R599 Enlarged lymph nodes, unspecified: Secondary | ICD-10-CM

## 2024-04-20 ENCOUNTER — Other Ambulatory Visit: Payer: Self-pay | Admitting: Family Medicine

## 2024-04-20 DIAGNOSIS — R599 Enlarged lymph nodes, unspecified: Secondary | ICD-10-CM

## 2024-08-14 ENCOUNTER — Ambulatory Visit
Admission: RE | Admit: 2024-08-14 | Discharge: 2024-08-14 | Disposition: A | Discharge status: Home / Self Care | Source: Ambulatory Visit | Attending: Family Medicine | Admitting: Family Medicine

## 2024-08-14 DIAGNOSIS — R599 Enlarged lymph nodes, unspecified: Secondary | ICD-10-CM
